# Patient Record
Sex: Female | Born: 1955 | Race: White | Hispanic: No | Marital: Married | Smoking: Former smoker
Health system: Southern US, Community
[De-identification: ages and names within clinical notes are randomized; demographics above are authoritative.]

## PROBLEM LIST (undated history)

## (undated) DIAGNOSIS — K55039 Acute (reversible) ischemia of large intestine, extent unspecified: Principal | ICD-10-CM

## (undated) DIAGNOSIS — Z860100 Personal history of colon polyps, unspecified: Secondary | ICD-10-CM

## (undated) DIAGNOSIS — F419 Anxiety disorder, unspecified: Secondary | ICD-10-CM

## (undated) DIAGNOSIS — F418 Other specified anxiety disorders: Secondary | ICD-10-CM

## (undated) DIAGNOSIS — M797 Fibromyalgia: Secondary | ICD-10-CM

## (undated) DIAGNOSIS — Z973 Presence of spectacles and contact lenses: Secondary | ICD-10-CM

## (undated) DIAGNOSIS — Z5189 Encounter for other specified aftercare: Secondary | ICD-10-CM

## (undated) DIAGNOSIS — S4292XA Fracture of left shoulder girdle, part unspecified, initial encounter for closed fracture: Secondary | ICD-10-CM

## (undated) DIAGNOSIS — R011 Cardiac murmur, unspecified: Secondary | ICD-10-CM

## (undated) DIAGNOSIS — F32A Depression, unspecified: Secondary | ICD-10-CM

## (undated) DIAGNOSIS — G4700 Insomnia, unspecified: Secondary | ICD-10-CM

## (undated) DIAGNOSIS — K5790 Diverticulosis of intestine, part unspecified, without perforation or abscess without bleeding: Secondary | ICD-10-CM

## (undated) HISTORY — DX: Personal history of colonic polyps: Z86.010

## (undated) HISTORY — DX: Anxiety disorder, unspecified: F41.9

## (undated) HISTORY — PX: HYSTEROTOMY: SHX1776

## (undated) HISTORY — PX: CATARACT EXTRACTION: SUR2

## (undated) HISTORY — DX: Insomnia, unspecified: G47.00

## (undated) HISTORY — DX: Diverticulosis of intestine, part unspecified, without perforation or abscess without bleeding: K57.90

## (undated) HISTORY — PX: TONSILLECTOMY: SUR1361

## (undated) HISTORY — PX: ABDOMINAL HYSTERECTOMY: SHX81

## (undated) HISTORY — DX: Cardiac murmur, unspecified: R01.1

## (undated) HISTORY — DX: Fibromyalgia: M79.7

## (undated) HISTORY — DX: Other specified anxiety disorders: F41.8

## (undated) HISTORY — PX: WISDOM TOOTH EXTRACTION: SHX21

## (undated) HISTORY — PX: TONSILECTOMY, ADENOIDECTOMY, BILATERAL MYRINGOTOMY AND TUBES: SHX2538

## (undated) HISTORY — PX: DILATION AND CURETTAGE OF UTERUS: SHX78

## (undated) HISTORY — DX: Encounter for other specified aftercare: Z51.89

## (undated) HISTORY — DX: Acute (reversible) ischemia of large intestine, extent unspecified: K55.039

## (undated) HISTORY — DX: Personal history of colon polyps, unspecified: Z86.0100

---

## 1999-08-19 ENCOUNTER — Ambulatory Visit (HOSPITAL_COMMUNITY): Admission: RE | Admit: 1999-08-19 | Discharge: 1999-08-19 | Payer: Self-pay | Admitting: Gynecology

## 1999-08-19 ENCOUNTER — Encounter (INDEPENDENT_AMBULATORY_CARE_PROVIDER_SITE_OTHER): Payer: Self-pay | Admitting: Specialist

## 1999-10-21 ENCOUNTER — Encounter: Payer: Self-pay | Admitting: Gynecology

## 1999-10-21 ENCOUNTER — Encounter: Admission: RE | Admit: 1999-10-21 | Discharge: 1999-10-21 | Payer: Self-pay | Admitting: Gynecology

## 1999-10-27 ENCOUNTER — Other Ambulatory Visit: Admission: RE | Admit: 1999-10-27 | Discharge: 1999-10-27 | Payer: Self-pay | Admitting: Gynecology

## 2000-10-02 ENCOUNTER — Other Ambulatory Visit: Admission: RE | Admit: 2000-10-02 | Discharge: 2000-10-02 | Payer: Self-pay | Admitting: Gynecology

## 2000-10-29 ENCOUNTER — Encounter: Admission: RE | Admit: 2000-10-29 | Discharge: 2000-10-29 | Payer: Self-pay | Admitting: Gynecology

## 2000-10-29 ENCOUNTER — Encounter: Payer: Self-pay | Admitting: Gynecology

## 2001-07-23 ENCOUNTER — Encounter: Admission: RE | Admit: 2001-07-23 | Discharge: 2001-07-23 | Payer: Self-pay | Admitting: Gynecology

## 2001-07-23 ENCOUNTER — Encounter: Payer: Self-pay | Admitting: Gynecology

## 2001-10-22 ENCOUNTER — Other Ambulatory Visit: Admission: RE | Admit: 2001-10-22 | Discharge: 2001-10-22 | Payer: Self-pay | Admitting: Gynecology

## 2001-11-01 ENCOUNTER — Encounter: Admission: RE | Admit: 2001-11-01 | Discharge: 2001-11-01 | Payer: Self-pay

## 2001-11-01 ENCOUNTER — Encounter: Payer: Self-pay | Admitting: Gynecology

## 2002-10-28 ENCOUNTER — Other Ambulatory Visit: Admission: RE | Admit: 2002-10-28 | Discharge: 2002-10-28 | Payer: Self-pay | Admitting: Gynecology

## 2002-11-17 ENCOUNTER — Encounter: Admission: RE | Admit: 2002-11-17 | Discharge: 2002-11-17 | Payer: Self-pay | Admitting: Gynecology

## 2002-11-17 ENCOUNTER — Encounter: Payer: Self-pay | Admitting: Gynecology

## 2003-11-02 ENCOUNTER — Other Ambulatory Visit: Admission: RE | Admit: 2003-11-02 | Discharge: 2003-11-02 | Payer: Self-pay | Admitting: Gynecology

## 2004-01-04 ENCOUNTER — Encounter: Admission: RE | Admit: 2004-01-04 | Discharge: 2004-01-04 | Payer: Self-pay | Admitting: Gynecology

## 2004-11-07 ENCOUNTER — Other Ambulatory Visit: Admission: RE | Admit: 2004-11-07 | Discharge: 2004-11-07 | Payer: Self-pay | Admitting: Gynecology

## 2004-12-26 ENCOUNTER — Ambulatory Visit: Payer: Self-pay | Admitting: Internal Medicine

## 2005-01-04 ENCOUNTER — Encounter: Admission: RE | Admit: 2005-01-04 | Discharge: 2005-01-04 | Payer: Self-pay | Admitting: Gynecology

## 2005-08-14 HISTORY — PX: TOTAL ABDOMINAL HYSTERECTOMY W/ BILATERAL SALPINGOOPHORECTOMY: SHX83

## 2005-11-27 ENCOUNTER — Other Ambulatory Visit: Admission: RE | Admit: 2005-11-27 | Discharge: 2005-11-27 | Payer: Self-pay | Admitting: Gynecology

## 2006-01-09 ENCOUNTER — Encounter: Admission: RE | Admit: 2006-01-09 | Discharge: 2006-01-09 | Payer: Self-pay | Admitting: Gynecology

## 2006-07-16 ENCOUNTER — Ambulatory Visit (HOSPITAL_COMMUNITY): Admission: RE | Admit: 2006-07-16 | Discharge: 2006-07-17 | Payer: Self-pay | Admitting: Gynecology

## 2006-07-16 ENCOUNTER — Encounter (INDEPENDENT_AMBULATORY_CARE_PROVIDER_SITE_OTHER): Payer: Self-pay | Admitting: *Deleted

## 2006-12-25 ENCOUNTER — Other Ambulatory Visit: Admission: RE | Admit: 2006-12-25 | Discharge: 2006-12-25 | Payer: Self-pay | Admitting: Gynecology

## 2007-01-14 ENCOUNTER — Encounter: Admission: RE | Admit: 2007-01-14 | Discharge: 2007-01-14 | Payer: Self-pay | Admitting: Gynecology

## 2007-09-10 ENCOUNTER — Ambulatory Visit: Payer: Self-pay | Admitting: Internal Medicine

## 2007-09-20 ENCOUNTER — Ambulatory Visit: Payer: Self-pay | Admitting: Internal Medicine

## 2007-09-20 ENCOUNTER — Encounter: Payer: Self-pay | Admitting: Internal Medicine

## 2007-09-20 HISTORY — PX: COLONOSCOPY W/ BIOPSIES: SHX1374

## 2008-01-21 ENCOUNTER — Encounter: Admission: RE | Admit: 2008-01-21 | Discharge: 2008-01-21 | Payer: Self-pay | Admitting: Gynecology

## 2009-01-22 ENCOUNTER — Encounter: Admission: RE | Admit: 2009-01-22 | Discharge: 2009-01-22 | Payer: Self-pay | Admitting: Gynecology

## 2009-02-03 ENCOUNTER — Encounter: Admission: RE | Admit: 2009-02-03 | Discharge: 2009-02-03 | Payer: Self-pay | Admitting: Gynecology

## 2010-02-07 ENCOUNTER — Encounter: Admission: RE | Admit: 2010-02-07 | Discharge: 2010-02-07 | Payer: Self-pay | Admitting: Gynecology

## 2010-09-05 ENCOUNTER — Encounter: Payer: Self-pay | Admitting: Gynecology

## 2010-12-30 NOTE — H&P (Signed)
Rebecca Chapman, Rebecca Chapman              ACCOUNT NO.:  [id number]   MEDICAL RECORD NO.:  [id number]          PATIENT TYPE:  OIB   LOCATION:  1612                         FACILITY:  [facility]   PHYSICIAN:  Gretta Cool, M.D. DATE OF BIRTH:  Apr 28, 1956   DATE OF ADMISSION:  07/16/2006  DATE OF DISCHARGE:                              HISTORY & PHYSICAL   PREOPERATIVE DIAGNOSIS:  Incapacitating pelvic pain and dyspareunia.   POSTOPERATIVE DIAGNOSIS:  Endometriosis and probable adenomyosis with  incapacitating cyclic pain.   PROCEDURE:  Total abdominal hysterectomy and bilateral salpingo-  oophorectomy, lysis of adhesions, and revision of keloid scar from two  previous cesarean sections.   SURGEON:  Gretta Cool, M.D.   ASSISTANT:  Almedia Balls. Randell Patient, M.D.   ANESTHESIA:  General orotracheal.   DESCRIPTION OF PROCEDURE:  Under excellent anesthesia, as above, patient  was prepped and draped in the lithotomy position with Foley catheter  draining her bladder, a Pfannenstiel incision was made by excision of  her previous two keloid cesarean section scars.  The scars were totally  excised.  The incision was then extended through the fascia, the  peritoneum opened, and the abdomen explored.  There were no  abnormalities identified in the upper abdomen.  Examination of the  pelvis revealed rectosigmoid adherent to the left fallopian tube at the  site of endometriosis involving the distal tube and ovary.  The  adhesions were lysed by cautery and sharp dissection.  At this point,  the bowel was packed out of the way and the uterus elevated with Kelly  clamps across the adnexal structures.  A baby Education administrator  was used.  At this point, the round ligaments were transected.  The  anterior leaf of the broad ligament was then opened and pushed off the  lower segment.  The infundibulopelvic vessels were then isolated from  the aorta, clamped, cut, and suture-tied with 0 Vicryl.  A second  suture  was used to doubly ligate the infundibulopelvic on each side.  At this  point, the uterine vessels were clamped, cut, sutured, and tied.  The  cardinal ligaments were then clamped, cut, sutured, and tied with 0  Vicryl.  The uterosacral ligaments were then clamped, cut, sutured, and  tied with 0 Vicryl.  The central portion of the vagina was then  approximated with a running suture of 0 Vicryl from each angle to the  midline.  At this point, a cardinal uterosacral colposuspension was  performed using 2-0 Novofil.  The cardinal uterosacral complex was  affixed to the apex of the vaginal cuff.  At this point, the pelvic  floor is reperitonealized with a running suture of 2-0 Monocryl.  The  sponges and retractors were then removed.  The abdominal peritoneum was  then closed with a running suture of 0 Monocryl from the umbilicus to  the symphysis.  The rectus muscles were plicated in the midline.  Also  with the same 0 Monocryl.  The fascia was then approximated from each  angle to the midline with a running suture of 0 Vicryl.  The  subcutaneous tissues were then approximated with interrupted sutures of  3-0 Vicryl, and the skin was closed with skin staples and intermittent  sutures to prevent inversion of the incision edge because of a torque  from her previous incision and resection of the previous scars.  Patient  returned to the recovery room in excellent condition.           ______________________________  Gretta Cool, M.D.     CWL/MEDQ  D:  07/16/2006  T:  07/16/2006  Job:  [id number]

## 2010-12-30 NOTE — H&P (Signed)
NAMEVELA, RENDER              ACCOUNT NO.:  [id number]   MEDICAL RECORD NO.:  [id number]          PATIENT TYPE:  AMB   LOCATION:  DAY                          FACILITY:  [facility]   PHYSICIAN:  Gretta Cool, M.D. DATE OF BIRTH:  [birth year]   DATE OF ADMISSION:  DATE OF DISCHARGE:                              HISTORY & PHYSICAL   CHIEF COMPLAINT:  Cyclic pelvic pain and dyspareunia.   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  She had cesarean section delivery and  successful pregnancy outcome x2 with cesarean section.  She had multiple  pregnancy loss in the first trimester and small fetuses weighing only 5  pounds 13 ounces.  She has had no sequelae of this until the last few  years when she developed abnormal uterine bleeding.  She had  hysteroscopy and attempted resection of her endometrium.  She had a T-  shaped uterus that made hysteroscopy very difficult and incomplete.  She  has subsequently developed a hematometra drained successfully but she  continues to have elevation of CA125 and evidence of endometriosis with  incapacitating cyclic pelvic pain.  She now has intense dyspareunia as  well.  She is admitted for definitive therapy by abdominal hysterectomy,  possible salpingo-oophorectomy, possible excision of extensive  endometriosis.   PAST MEDICAL HISTORY:  1. Usual childhood disease without sequelae.  2. DES exposure in utero.  3. 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   PREVIOUS SURGERIES:  1. D&C x3.  2. Cesarean section x2.  3. T&A as a child.  4. Wisdom teeth extractions.   ALLERGIES:  SULFA drugs.   PRESENT MEDICATION:  1. Xanax 0.5.  2. Temazepam as needed for anxiety.   FAMILY HISTORY:  Father is living with heart problems.  Mother is  deceased from suicide and multiple sclerosis.  One sister has lupus and  has had breast disease.  One brother in good health.   No other known  familial tendency to disease.   REVIEW OF SYSTEMS:  HEENT:  Denies symptoms.  CARDIORESPIRATORY:  Denies  asthma, cough, bronchitis, shortness of breath.  GI/GU:  Denies  frequency, urgency, dysuria, change in bowel habits, food intolerance.   PHYSICAL EXAMINATION:  A well-developed, well-nourished, thin white  female.  HEENT:  Pupils are equal, round, and reactive to light and  accommodation.  Fundi not examined.  Oropharynx clear.  Neck supple  without masses or thyroid enlargement.  CHEST:  Clear P to A.  HEART:  Regular rhythm without murmur or cardiac enlargement.  BREASTS:  Without mass, tenderness or nipple discharge.  ABDOMEN:  Soft, scaphoid without mass or organomegaly.  C-section scar  x2.  PELVIC:  External genitalia normal female vagina, clean, rugose.  Cervix  is small with changes related to DES exposure in utero.  Uterus is  hypoplastic.  Adnexa clear.  Tender to manipulation.  Rectovaginal exam  confirms.  EXTREMITIES:  Negative.  NEUROLOGIC:  Physiologic.   IMPRESSION:  1. Incapacitating cyclic pelvic pain compatible with adenomyosis  versus endometriosis with elevation of CA125.  2. Severe dyspareunia.  3. History of abnormal uterine bleeding and hysteroscopy.  4. Generalized anxiety disorder.   PLAN:  Total abdominal hysterectomy, possible bilateral salpingo-  oophorectomy and excision of extensive endometriosis.  The patient  understands the options and alternatives.  All risks and benefits, pain  management, recovery.  She is now admitted for definitive therapy.           ______________________________  Gretta Cool, M.D.     CWL/MEDQ  D:  07/15/2006  T:  07/16/2006  Job:  [id number]   cc:   Titus Dubin. Alwyn Ren, MD,FACP,FCCP  [phone] [place]  [place] [address]

## 2011-01-03 ENCOUNTER — Other Ambulatory Visit: Payer: Self-pay | Admitting: Gynecology

## 2011-01-03 DIAGNOSIS — Z1231 Encounter for screening mammogram for malignant neoplasm of breast: Secondary | ICD-10-CM

## 2011-02-09 ENCOUNTER — Other Ambulatory Visit: Payer: Self-pay | Admitting: Gynecology

## 2011-02-09 ENCOUNTER — Ambulatory Visit
Admission: RE | Admit: 2011-02-09 | Discharge: 2011-02-09 | Disposition: A | Discharge status: Home / Self Care | Payer: BC Managed Care – PPO | Source: Ambulatory Visit | Attending: Gynecology | Admitting: Gynecology

## 2011-02-09 DIAGNOSIS — Z1231 Encounter for screening mammogram for malignant neoplasm of breast: Secondary | ICD-10-CM

## 2012-01-02 ENCOUNTER — Other Ambulatory Visit: Payer: Self-pay | Admitting: Gynecology

## 2012-01-02 DIAGNOSIS — Z1231 Encounter for screening mammogram for malignant neoplasm of breast: Secondary | ICD-10-CM

## 2012-02-20 ENCOUNTER — Ambulatory Visit: Payer: BC Managed Care – PPO

## 2012-02-20 ENCOUNTER — Other Ambulatory Visit: Payer: Self-pay | Admitting: Gynecology

## 2012-02-26 ENCOUNTER — Ambulatory Visit
Admission: RE | Admit: 2012-02-26 | Discharge: 2012-02-26 | Disposition: A | Discharge status: Home / Self Care | Payer: BC Managed Care – PPO | Source: Ambulatory Visit | Attending: Gynecology | Admitting: Gynecology

## 2012-02-26 DIAGNOSIS — Z1231 Encounter for screening mammogram for malignant neoplasm of breast: Secondary | ICD-10-CM

## 2012-07-11 ENCOUNTER — Observation Stay (HOSPITAL_COMMUNITY): Payer: BC Managed Care – PPO

## 2012-07-11 ENCOUNTER — Observation Stay (HOSPITAL_COMMUNITY)
Admission: EM | Admit: 2012-07-11 | Discharge: 2012-07-11 | Disposition: A | Discharge status: Home / Self Care | Payer: BC Managed Care – PPO | Attending: Emergency Medicine | Admitting: Emergency Medicine

## 2012-07-11 ENCOUNTER — Encounter (HOSPITAL_COMMUNITY): Payer: Self-pay | Admitting: Emergency Medicine

## 2012-07-11 DIAGNOSIS — K55039 Acute (reversible) ischemia of large intestine, extent unspecified: Secondary | ICD-10-CM

## 2012-07-11 DIAGNOSIS — Z860100 Personal history of colon polyps, unspecified: Secondary | ICD-10-CM | POA: Insufficient documentation

## 2012-07-11 DIAGNOSIS — R110 Nausea: Secondary | ICD-10-CM | POA: Insufficient documentation

## 2012-07-11 DIAGNOSIS — R630 Anorexia: Secondary | ICD-10-CM | POA: Insufficient documentation

## 2012-07-11 DIAGNOSIS — Z90710 Acquired absence of both cervix and uterus: Secondary | ICD-10-CM | POA: Insufficient documentation

## 2012-07-11 DIAGNOSIS — K921 Melena: Secondary | ICD-10-CM | POA: Insufficient documentation

## 2012-07-11 DIAGNOSIS — R1012 Left upper quadrant pain: Principal | ICD-10-CM | POA: Insufficient documentation

## 2012-07-11 DIAGNOSIS — R197 Diarrhea, unspecified: Secondary | ICD-10-CM | POA: Insufficient documentation

## 2012-07-11 DIAGNOSIS — K5900 Constipation, unspecified: Secondary | ICD-10-CM | POA: Insufficient documentation

## 2012-07-11 DIAGNOSIS — R1032 Left lower quadrant pain: Secondary | ICD-10-CM

## 2012-07-11 HISTORY — DX: Acute (reversible) ischemia of large intestine, extent unspecified: K55.039

## 2012-07-11 LAB — CBC
Hemoglobin: 13.8 g/dL (ref 12.0–15.0)
MCH: 31.2 pg (ref 26.0–34.0)
MCHC: 33.7 g/dL (ref 30.0–36.0)
Platelets: 234 10*3/uL (ref 150–400)
RDW: 12.9 % (ref 11.5–15.5)
WBC: 8.8 10*3/uL (ref 4.0–10.5)

## 2012-07-11 LAB — URINALYSIS, ROUTINE W REFLEX MICROSCOPIC
Bilirubin Urine: NEGATIVE
Glucose, UA: NEGATIVE mg/dL
Ketones, ur: NEGATIVE mg/dL
Leukocytes, UA: NEGATIVE
Specific Gravity, Urine: 1.003 — ABNORMAL LOW (ref 1.005–1.030)
Urobilinogen, UA: 0.2 mg/dL (ref 0.0–1.0)

## 2012-07-11 LAB — URINE MICROSCOPIC-ADD ON

## 2012-07-11 LAB — COMPREHENSIVE METABOLIC PANEL
ALT: 12 U/L (ref 0–35)
CO2: 26 mEq/L (ref 19–32)
GFR calc non Af Amer: >90 mL/min (ref >90)
Sodium: 138 mEq/L (ref 135–145)
Total Protein: 7.5 g/dL (ref 6.0–8.3)

## 2012-07-11 LAB — OCCULT BLOOD, POC DEVICE: Fecal Occult Bld: POSITIVE

## 2012-07-11 MED ORDER — SODIUM CHLORIDE 0.9 % IV BOLUS (SEPSIS)
1000.0000 mL | Freq: Once | INTRAVENOUS | Status: AC
  Administered 2012-07-11: 1000 mL via INTRAVENOUS

## 2012-07-11 MED ORDER — HYDROCODONE-ACETAMINOPHEN 5-325 MG PO TABS: 1.0000 | ORAL_TABLET | Freq: Four times a day (QID) | ORAL | Status: DC | PRN

## 2012-07-11 MED ORDER — IOHEXOL 300 MG/ML  SOLN
100.0000 mL | Freq: Once | INTRAMUSCULAR | Status: AC | PRN
  Administered 2012-07-11: 100 mL via INTRAVENOUS

## 2012-07-11 MED ORDER — CIPROFLOXACIN HCL 500 MG PO TABS: 500.0000 mg | ORAL_TABLET | Freq: Two times a day (BID) | ORAL | Status: DC

## 2012-07-11 MED ORDER — CIPROFLOXACIN HCL 500 MG PO TABS
500.0000 mg | ORAL_TABLET | Freq: Once | ORAL | Status: AC
  Administered 2012-07-11: 500 mg via ORAL
  Filled 2012-07-11: qty 1

## 2012-07-11 MED ORDER — METRONIDAZOLE 500 MG PO TABS
500.0000 mg | ORAL_TABLET | Freq: Once | ORAL | Status: AC
  Administered 2012-07-11: 500 mg via ORAL
  Filled 2012-07-11: qty 1

## 2012-07-11 MED ORDER — METRONIDAZOLE 500 MG PO TABS: 500.0000 mg | ORAL_TABLET | Freq: Two times a day (BID) | ORAL | Status: DC

## 2012-07-11 MED ORDER — FAMOTIDINE IN NACL 20-0.9 MG/50ML-% IV SOLN
20.0000 mg | Freq: Once | INTRAVENOUS | Status: AC
  Administered 2012-07-11: 20 mg via INTRAVENOUS
  Filled 2012-07-11: qty 50

## 2012-07-11 NOTE — ED Notes (Signed)
PT HAS ARRIVED IN CDU FROM CT 

## 2012-07-11 NOTE — ED Provider Notes (Signed)
55 yo sent to CDU pending CT results for r/o diverticulitis based on presentation of hematochezia & LLQ pain. BP 146/74  Pulse 70  Temp 97.6 F (36.4 C) (Oral)  Resp 20  SpO2 100% Imaging as below discussed w attending Dr. Oletta Lamas who feels pt can be dc w cipro, flagyl, pain medication and GI follow up. LAbs reviewed & hgb nl. STRICT return precautions discussed. Pt agreeable with plan. First dose of abx given in CDU.   CT ABDOMEN AND PELVIS WITH CONTRAST Technique: Multidetector CT imaging of the abdomen and pelvis was performed following the standard protocol during bolus administration of intravenous contrast. Contrast: OMNIPAQUE IOHEXOL 300 MG/ML SOLN Comparison: None. Findings: Lung bases are clear. Liver, gallbladder, adrenal glands, kidneys, spleen, and pancreas are normal. No intra- abdominal free air, lymphadenopathy, or free fluid. There is short segmental splenic flexure transverse/descending colonic wall thickening and minimal surrounding stranding. No surrounding rim enhancing fluid collection is identified to suggest abscess formation. Celiac axis, superior mesenteric artery, and inferior mesenteric artery are patent. No other area of bowel wall thickening is identified. No bowel dilatation. The appendix is not visualized but there is no secondary evidence for acute appendicitis. Cecum is low lying within the pelvis. Uterus and ovaries not visualized but no adnexal mass. No acute osseous finding. Lumbar spine degenerative change is noted particularly at L5 S1. IMPRESSION: Short segmental splenic flexure transverse/descending colonic wall thickening with minimal surrounding stranding but no evidence for abscess formation or perforation. Differential considerations include focal colitis, ischemia and given the watershed distribution, inflammatory bowel disease, or other enteritis.     Jaci Carrel, [place] 07/11/12 1630

## 2012-07-11 NOTE — ED Provider Notes (Signed)
Medical screening examination/treatment/procedure(s) were performed by non-physician practitioner and as supervising physician I was immediately available for consultation/collaboration.   Gavin Pound. Junie Engram, MD 07/11/12 1606

## 2012-07-11 NOTE — ED Notes (Signed)
PT HAS GONE TO CT. WILL COME TO CDU AFTER STUDY

## 2012-07-11 NOTE — ED Notes (Signed)
Pt sts diarrhea yesterday with BRB this am and LLQ pain

## 2012-07-11 NOTE — ED Provider Notes (Signed)
History     CSN: [id number]  Arrival date & time 07/11/12  1222   First MD Initiated Contact with Patient 07/11/12 1230      Chief Complaint  Patient presents with  . Rectal Bleeding    (Consider location/radiation/quality/duration/timing/severity/associated sxs/prior treatment) Patient is a 56 y.o. female presenting with hematochezia. The history is provided by the patient and the spouse. No language interpreter was used.  Rectal Bleeding  The current episode started today. The onset was gradual. The problem occurs occasionally. The pain is mild. The stool is described as liquid and bloody. Associated symptoms include abdominal pain and diarrhea. Pertinent negatives include no fever, no nausea, no rectal pain, no vomiting, no hematuria, no vaginal bleeding, no vaginal discharge, no headaches, no coughing and no difficulty breathing. She has been behaving normally. She has been eating less than usual. Urine output has been normal. Her past medical history does not include recent antibiotic use or a recent illness. There were no sick contacts.   57 year old female coming in with rectal bleeding since 2 AM this morning. States that she has been passing clots in her stool with positive diarrhea. States that the clots are bright red and showed me a picture of the same.   States she's also had some nausea. A week ago patient was constipated.   Past medical history of abdominal hysterectomy. States that 5 years ago she had a colonoscopy with the [facility] route that showed polyps.  Patient denies dizziness or weakness. Her PCP is Dr. Timothy Lasso. She has had decreased appetite and is able to take by mouth fluids. Takes 81 mg asa / day.     History reviewed. No pertinent past medical history.  History reviewed. No pertinent past surgical history.  History reviewed. No pertinent family history.  History  Substance Use Topics  . Smoking status: Never Smoker   . Smokeless tobacco: Not on file  .  Alcohol Use: Yes    OB History    Grav Para Term Preterm Abortions TAB SAB Ect Mult Living                  Review of Systems  Constitutional: Negative.  Negative for fever.  HENT: Negative.   Eyes: Negative.   Respiratory: Negative.  Negative for cough and shortness of breath.   Cardiovascular: Negative.   Gastrointestinal: Positive for abdominal pain, diarrhea, constipation, blood in stool and hematochezia. Negative for nausea, vomiting, abdominal distention and rectal pain.  Genitourinary: Negative for dysuria, urgency, frequency, hematuria, vaginal bleeding, vaginal discharge and difficulty urinating.  Musculoskeletal: Negative for back pain.  Skin: Negative.   Neurological: Negative.  Negative for headaches.  Psychiatric/Behavioral: Negative.   All other systems reviewed and are negative.    Allergies  Other and Sulfa antibiotics  Home Medications   Current Outpatient Rx  Name  Route  Sig  Dispense  Refill  . ALPRAZOLAM 0.5 MG PO TABS   Oral   Take 0.25 mg by mouth at bedtime as needed. For sleep         . ASPIRIN EC 81 MG PO TBEC   Oral   Take 81 mg by mouth daily.         Marland Kitchen VITAMIN D 1000 UNITS PO TABS   Oral   Take 2,000 Units by mouth daily. Gummies         . ESTRADIOL 0.0375 MG/24HR TD PTTW   Transdermal   Place 0.5 patches onto the skin 2 (two) times  a week.         . NORTRIPTYLINE HCL 10 MG PO CAPS   Oral   Take 10 mg by mouth at bedtime.           BP 145/84  Pulse 70  Temp 97.6 F (36.4 C) (Oral)  Resp 20  SpO2 100%  Physical Exam  Nursing note and vitals reviewed. Constitutional: She is oriented to person, place, and time. She appears well-developed and well-nourished.  HENT:  Head: Normocephalic and atraumatic.  Eyes: Conjunctivae normal and EOM are normal. Pupils are equal, round, and reactive to light.  Neck: Normal range of motion. Neck supple.  Cardiovascular: Normal rate.   Pulmonary/Chest: Effort normal and breath  sounds normal. No respiratory distress. She has no wheezes.  Abdominal: Soft. She exhibits no distension and no mass. There is tenderness. There is no rebound and no guarding.       Left lower quadrant tenderness  Musculoskeletal: Normal range of motion. She exhibits no edema and no tenderness.  Neurological: She is alert and oriented to person, place, and time. She has normal reflexes.  Skin: Skin is warm and dry.  Psychiatric: She has a normal mood and affect.    ED Course  Procedures (including critical care time)    Hemacult +    Report given to [facility] on CDU unit where patient will go awaiting CT of the abdomen to r/o diverticulitis.  If positive for diverticulitis patient will go home on antibiotics with a GI referral.  If no diverticulitis GI follow up only.  Patient and spouse understand the plan and agree.   Labs Reviewed  COMPREHENSIVE METABOLIC PANEL - Abnormal; Notable for the following:    Glucose, Bld 116 (*)     All other components within normal limits  URINALYSIS, ROUTINE W REFLEX MICROSCOPIC - Abnormal; Notable for the following:    Color, Urine COLORLESS (*)     Specific Gravity, Urine 1.003 (*)     Hgb urine dipstick SMALL (*)     All other components within normal limits  CBC  URINE MICROSCOPIC-ADD ON  OCCULT BLOOD X 1 CARD TO LAB, STOOL  OCCULT BLOOD, POC DEVICE   No results found.   No diagnosis found.    MDM          Remi Haggard, NP 07/11/12 1537

## 2012-07-11 NOTE — ED Provider Notes (Signed)
Medical screening examination/treatment/procedure(s) were performed by non-physician practitioner and as supervising physician I was immediately available for consultation/collaboration.  CT suggestive of colitis, not overt diverticulitis, however clinical history is suggestive of diverticulitis, will treat with oral abx and pt can be referred to PCP and/or GI follow up.    Rebecca Chapman. Oletta Lamas, MD 07/11/12 [id number]

## 2012-07-15 ENCOUNTER — Encounter: Payer: Self-pay | Admitting: Internal Medicine

## 2012-07-29 ENCOUNTER — Encounter: Payer: Self-pay | Admitting: Internal Medicine

## 2012-07-29 ENCOUNTER — Other Ambulatory Visit (INDEPENDENT_AMBULATORY_CARE_PROVIDER_SITE_OTHER): Payer: BC Managed Care – PPO

## 2012-07-29 ENCOUNTER — Ambulatory Visit (INDEPENDENT_AMBULATORY_CARE_PROVIDER_SITE_OTHER): Payer: BC Managed Care – PPO | Admitting: Internal Medicine

## 2012-07-29 VITALS — BP 108/72 | HR 81 | Ht 66.0 in | Wt 120.4 lb

## 2012-07-29 DIAGNOSIS — K55039 Acute (reversible) ischemia of large intestine, extent unspecified: Secondary | ICD-10-CM

## 2012-07-29 DIAGNOSIS — K55059 Acute (reversible) ischemia of intestine, part and extent unspecified: Secondary | ICD-10-CM

## 2012-07-29 DIAGNOSIS — K589 Irritable bowel syndrome without diarrhea: Secondary | ICD-10-CM | POA: Insufficient documentation

## 2012-07-29 LAB — CBC WITH DIFFERENTIAL/PLATELET
Basophils Relative: 0.3 % (ref 0.0–3.0)
Eosinophils Relative: 1.5 % (ref 0.0–5.0)
HCT: 35.9 % — ABNORMAL LOW (ref 36.0–46.0)
Lymphs Abs: 1.4 10*3/uL (ref 0.7–4.0)
MCV: 91.4 fl (ref 78.0–100.0)
Monocytes Relative: 11.4 % (ref 3.0–12.0)
Platelets: 219 10*3/uL (ref 150.0–400.0)
RBC: 3.93 Mil/uL (ref 3.87–5.11)
WBC: 4.4 10*3/uL — ABNORMAL LOW (ref 4.5–10.5)

## 2012-07-29 MED ORDER — DICYCLOMINE HCL 20 MG PO TABS
20.0000 mg | ORAL_TABLET | Freq: Four times a day (QID) | ORAL | Status: DC | PRN
Start: 2012-07-29 — End: 2013-06-02

## 2012-07-29 MED ORDER — ALIGN PO CAPS: 1.0000 | ORAL_CAPSULE | Freq: Every day | ORAL | Status: DC

## 2012-07-29 NOTE — Patient Instructions (Addendum)
My diagnosis is acute ischemic colitis and Irritable Bowel Syndrome. I expect a full recovery from the colitis but may take some time to settle down after the antibiotics and healing.  Take Align each day and use dicyclomine as needed for abdominal pain. You may take it before meals to prevent problems.  I will see you in one month but contact us sooner as needed.  I will let you know the CBC results by My Chart or phone.  Thank you for choosing me and [facility].  Iva Boop, MD, Clementeen Graham

## 2012-07-29 NOTE — Progress Notes (Signed)
Subjective:    Patient ID: Rebecca Chapman, female    DOB: 1955/11/02, 56 y.o.   MRN: [id number] Referred by: Gwen Pounds, MD  HPI Is a very nice lady who was in her usual state of health, although she was constipated for about 2 weeks. Then around November 28 she had acute left lower quadrant pain and hematochezia. She went to the emergency department where there was a limited area of colitis seen on CT scan with stranding and thickening of the wall around the splenic flexure and distal transverse colon. She stopped bleeding within a day or so but she had ongoing abdominal pain which has persisted though is much better. She was given a course of Cipro and metronidazole as empiric therapy. Stools are somewhat loose at this time. She is feeling better but is concerned about what this might have been. She took an omeprazole he other day with some relief of left-sided abdominal pain. She is still feeling bloated and gassy and having borborygmi.  Colonoscopy about 5-6 years ago was reviewed and is negative, she had a hyperplastic polyp in the cecum.  She has recently been evaluated for some headaches her head pain, there is some vascular changes on an MRI and white spots in the gray matter it sounds like and she is due for a second opinion at [facility] tomorrow. She is wondering if there is any leak.  Allergies  Allergen Reactions  . Other     Crab causes nausea  . Sulfa Antibiotics Rash   Outpatient Prescriptions Prior to Visit  Medication Sig Dispense Refill  . ALPRAZolam (XANAX) 0.5 MG tablet Take 0.25 mg by mouth at bedtime as needed. For sleep      . aspirin EC 81 MG tablet Take 81 mg by mouth daily.      . cholecalciferol (VITAMIN D) 1000 UNITS tablet Take 2,000 Units by mouth daily. Gummies      . estradiol (VIVELLE-DOT) 0.0375 MG/24HR Place 0.5 patches onto the skin 2 (two) times a week.      . nortriptyline (PAMELOR) 10 MG capsule Take 10 mg by mouth at bedtime.      . [DISCONTINUED]  ciprofloxacin (CIPRO) 500 MG tablet Take 1 tablet (500 mg total) by mouth 2 (two) times daily.  20 tablet  0  . [DISCONTINUED] metroNIDAZOLE (FLAGYL) 500 MG tablet Take 1 tablet (500 mg total) by mouth 2 (two) times daily.  14 tablet  0  . [DISCONTINUED] HYDROcodone-acetaminophen (NORCO/VICODIN) 5-325 MG per tablet Take 1 tablet by mouth every 6 (six) hours as needed for pain.  15 tablet  0   Last reviewed on 07/29/2012  2:48 PM by Iva Boop, MD Past Medical History  Diagnosis Date  . Diverticulosis   . History of colon polyps     Hyperplastic   . Anxiety    Past Surgical History  Procedure Date  . Colonoscopy w/ biopsies   . Hysterotomy   . Cesarean section     x 2  . Tonsilectomy, adenoidectomy, bilateral myringotomy and tubes    History   Social History  . Marital Status: Married                 Social History Main Topics  . Smoking status: Never Smoker   . Smokeless tobacco: Never Used  . Alcohol Use: Yes     Comment: a glass of wine with dinner  . Drug Use: No   School Agricultural consultant 2 sons  Family  History  Problem Relation Age of Onset  . Heart disease Father   . Multiple sclerosis Mother       Review of Systems As per history of present illness, all other review of systems are negative at this time.    Objective:   Physical Exam General:  Well-developed, well-nourished and in no acute distress Eyes:  anicteric. ENT:   Mouth and posterior pharynx free of lesions.  Neck:   supple w/o thyromegaly or mass.  Lungs: Clear to auscultation bilaterally. Heart:  S1S2, no rubs, murmurs, gallops. Abdomen:  soft, non-tender, no hepatosplenomegaly, hernia, or mass and BS+.  Lymph:  no cervical or supraclavicular adenopathy. Extremities:   no edema Skin   no rash. Neuro:  A&O x 3.  Psych:  appropriate mood and  Affect.   Data Reviewed: Lab Results  Component Value Date   WBC 8.8 07/11/2012   HGB 13.8 07/11/2012   HCT 41.0 07/11/2012   MCV 92.6  07/11/2012   PLT 234 07/11/2012    CT scan 07/11/2012 with findings as outlined in the history of present illness images are reviewed with the patient as well  IMPRESSION:  Short segmental splenic flexure transverse/descending colonic wall  thickening with minimal surrounding stranding but no evidence for  abscess formation or perforation. Differential considerations  include focal colitis, ischemia and given the watershed  distribution, inflammatory bowel disease, or other enteritis.     Assessment & Plan:   1. Acute ischemic colitis   2. IBS (irritable bowel syndrome)    1. She is improving overall I think she has some post colitis IBS and probably has IBS at baseline which predisposes people to acute ischemic colitis from spasm. Note that celiac, SMA and IMA trunks were all open on her CT scan. 2. I have recommended an prescribed align probiotic once a day for a month 3. Dicyclomine 20 mg every 6 hours as needed for cramps is prescribed 4. She will return in a month, we'll also check a hemoglobin today with a CBC. 5. She is reassured at this point, I think this is a self-limited problem. I don't think there is any relationship to whatever is going on in her brain, I don't think she has a vasculitis that's always possible seems unlikely. 6. Given that she had a colonoscopy in last 5-6 years and these are classic findings and symptoms of ischemic colitis I don't feel compelled to pursue a repeat colonoscopy at this time, we will decide further upon followup.  CC: Gwen Pounds, MD

## 2012-09-10 ENCOUNTER — Ambulatory Visit: Payer: BC Managed Care – PPO | Admitting: Internal Medicine

## 2012-09-28 ENCOUNTER — Other Ambulatory Visit: Payer: Self-pay

## 2013-01-29 ENCOUNTER — Other Ambulatory Visit: Payer: Self-pay

## 2013-01-29 DIAGNOSIS — Z1231 Encounter for screening mammogram for malignant neoplasm of breast: Secondary | ICD-10-CM

## 2013-03-05 ENCOUNTER — Ambulatory Visit
Admission: RE | Admit: 2013-03-05 | Discharge: 2013-03-05 | Disposition: A | Discharge status: Home / Self Care | Payer: BC Managed Care – PPO | Source: Ambulatory Visit

## 2013-03-05 DIAGNOSIS — Z1231 Encounter for screening mammogram for malignant neoplasm of breast: Secondary | ICD-10-CM

## 2013-06-02 ENCOUNTER — Ambulatory Visit (INDEPENDENT_AMBULATORY_CARE_PROVIDER_SITE_OTHER): Payer: BC Managed Care – PPO | Admitting: Sports Medicine

## 2013-06-02 ENCOUNTER — Ambulatory Visit
Admission: RE | Admit: 2013-06-02 | Discharge: 2013-06-02 | Disposition: A | Discharge status: Home / Self Care | Payer: BC Managed Care – PPO | Source: Ambulatory Visit | Attending: Sports Medicine | Admitting: Sports Medicine

## 2013-06-02 ENCOUNTER — Encounter: Payer: Self-pay | Admitting: Sports Medicine

## 2013-06-02 VITALS — BP 133/79 | HR 60 | Ht 66.0 in | Wt 118.0 lb

## 2013-06-02 DIAGNOSIS — M545 Low back pain, unspecified: Secondary | ICD-10-CM

## 2013-06-03 ENCOUNTER — Telehealth: Payer: Self-pay | Admitting: Sports Medicine

## 2013-06-03 NOTE — Telephone Encounter (Signed)
I spoke with Rebecca Chapman on the phone today regarding x-rays of her lumbar spine. She has some degenerative disc disease at L5-S1. She will go ahead and start physical therapy for core strengthening. Followup with me in 4 weeks.

## 2013-06-03 NOTE — Progress Notes (Signed)
  Subjective:    Patient ID: Rebecca Chapman, female    DOB: [birth year], 57 y.o.   MRN: [id number]  HPI chief complaint: Low back pain  Very pleasant 57 year old female comes in today complaining of 1 year of intermittent low back pain. She describes a stiffness in her low back which is present first thing in the morning and tends to improve as the day goes on. We'll also get occasional sharp pain in her back which will cause it to "lock up". This will happen only on occasion but she feels like it is happening more frequently. Her first episode of back pain occurred shortly after having a C-section several years ago. No previous low back surgeries. No groin pain. No radiating pain into her legs. No associated numbness or tingling. No change in bowel or bladder habits. She takes Advil as needed and does find it to be helpful. No history of cancer. No history of osteoporosis. Her medications are reviewed Allergies are reviewed He does not smoke, drinks alcohol on occasional basis, and works for the [employer] school system    Review of Systems     Objective:   Physical Exam Thin, no acute distress. Awake alert and oriented x3.  Good lumbar mobility. She has pain both with forward flexion and extension. There is no tenderness to palpation along the lumbar midline. No tenderness to palpation over the SI joints bilaterally. She has a negative log roll bilaterally. Negative straight leg raise bilaterally. Her strength is 5/5 both lower extremities with reflexes equal at the Achilles and patellar tendons bilaterally. No atrophy. Sensation is intact to light touch grossly. Negative FABERS. Walking without a limp.       Assessment & Plan:  Low back pain likely secondary to lumbar degenerative disc disease versus possible facet arthropathy  X-rays to include AP and lateral lumbar spine films. I think the patient would benefit greatly from physical therapy. She can continue with when necessary  Advil as well. Phone followup after I reviewed her x-rays. Followup in the office in 4 weeks.

## 2013-06-19 ENCOUNTER — Other Ambulatory Visit: Payer: Self-pay

## 2014-01-27 ENCOUNTER — Other Ambulatory Visit: Payer: Self-pay

## 2014-01-27 DIAGNOSIS — Z1231 Encounter for screening mammogram for malignant neoplasm of breast: Secondary | ICD-10-CM

## 2014-03-06 ENCOUNTER — Inpatient Hospital Stay: Admission: RE | Admit: 2014-03-06 | Payer: BC Managed Care – PPO | Source: Ambulatory Visit

## 2014-03-09 ENCOUNTER — Ambulatory Visit
Admission: RE | Admit: 2014-03-09 | Discharge: 2014-03-09 | Disposition: A | Discharge status: Home / Self Care | Payer: BC Managed Care – PPO | Source: Ambulatory Visit

## 2014-03-09 DIAGNOSIS — Z1231 Encounter for screening mammogram for malignant neoplasm of breast: Secondary | ICD-10-CM

## 2014-08-19 ENCOUNTER — Other Ambulatory Visit: Payer: Self-pay | Admitting: Internal Medicine

## 2014-08-19 DIAGNOSIS — R519 Headache, unspecified: Secondary | ICD-10-CM

## 2014-08-19 DIAGNOSIS — R51 Headache: Principal | ICD-10-CM

## 2014-08-26 ENCOUNTER — Ambulatory Visit
Admission: RE | Admit: 2014-08-26 | Discharge: 2014-08-26 | Disposition: A | Discharge status: Home / Self Care | Payer: BC Managed Care – PPO | Source: Ambulatory Visit | Attending: Internal Medicine | Admitting: Internal Medicine

## 2014-08-26 DIAGNOSIS — R51 Headache: Principal | ICD-10-CM

## 2014-08-26 DIAGNOSIS — R519 Headache, unspecified: Secondary | ICD-10-CM

## 2014-09-15 ENCOUNTER — Encounter (HOSPITAL_BASED_OUTPATIENT_CLINIC_OR_DEPARTMENT_OTHER): Payer: Self-pay | Admitting: *Deleted

## 2014-09-15 NOTE — Progress Notes (Signed)
No labs needed

## 2014-09-17 ENCOUNTER — Encounter (HOSPITAL_BASED_OUTPATIENT_CLINIC_OR_DEPARTMENT_OTHER): Payer: Self-pay | Admitting: Anesthesiology

## 2014-09-17 ENCOUNTER — Ambulatory Visit (HOSPITAL_BASED_OUTPATIENT_CLINIC_OR_DEPARTMENT_OTHER): Payer: BC Managed Care – PPO | Admitting: Anesthesiology

## 2014-09-17 ENCOUNTER — Encounter (HOSPITAL_BASED_OUTPATIENT_CLINIC_OR_DEPARTMENT_OTHER)
Admission: RE | Disposition: A | Discharge status: Home / Self Care | Payer: Self-pay | Source: Ambulatory Visit | Attending: Plastic Surgery

## 2014-09-17 ENCOUNTER — Ambulatory Visit (HOSPITAL_BASED_OUTPATIENT_CLINIC_OR_DEPARTMENT_OTHER)
Admission: RE | Admit: 2014-09-17 | Discharge: 2014-09-17 | Disposition: A | Discharge status: Home / Self Care | Payer: BC Managed Care – PPO | Source: Ambulatory Visit | Attending: Plastic Surgery | Admitting: Plastic Surgery

## 2014-09-17 DIAGNOSIS — H02839 Dermatochalasis of unspecified eye, unspecified eyelid: Secondary | ICD-10-CM | POA: Diagnosis not present

## 2014-09-17 HISTORY — PX: BROW LIFT: SHX178

## 2014-09-17 HISTORY — DX: Presence of spectacles and contact lenses: Z97.3

## 2014-09-17 LAB — POCT HEMOGLOBIN-HEMACUE: Hemoglobin: 14 g/dL (ref 12.0–15.0)

## 2014-09-17 SURGERY — BLEPHAROPLASTY
Anesthesia: General | Site: Eye | Laterality: Bilateral

## 2014-09-17 MED ORDER — LIDOCAINE-EPINEPHRINE (PF) 1 %-1:200000 IJ SOLN
INTRAMUSCULAR | Status: AC
  Filled 2014-09-17: qty 20

## 2014-09-17 MED ORDER — DEXAMETHASONE SODIUM PHOSPHATE 4 MG/ML IJ SOLN
INTRAMUSCULAR | Status: DC | PRN
  Administered 2014-09-17: 10 mg via INTRAVENOUS

## 2014-09-17 MED ORDER — PROPOFOL 10 MG/ML IV BOLUS
INTRAVENOUS | Status: DC | PRN
  Administered 2014-09-17: 200 mg via INTRAVENOUS

## 2014-09-17 MED ORDER — FENTANYL CITRATE 0.05 MG/ML IJ SOLN
INTRAMUSCULAR | Status: DC | PRN
  Administered 2014-09-17: 25 ug via INTRAVENOUS
  Administered 2014-09-17: 100 ug via INTRAVENOUS

## 2014-09-17 MED ORDER — PROPOFOL 10 MG/ML IV BOLUS
INTRAVENOUS | Status: AC
  Filled 2014-09-17: qty 20

## 2014-09-17 MED ORDER — OXYCODONE HCL 5 MG PO TABS: 5.0000 mg | ORAL_TABLET | Freq: Once | ORAL | Status: DC | PRN

## 2014-09-17 MED ORDER — LIDOCAINE-EPINEPHRINE (PF) 2 %-1:200000 IJ SOLN
INTRAMUSCULAR | Status: DC | PRN
  Administered 2014-09-17: 4 mL via INTRADERMAL

## 2014-09-17 MED ORDER — LACTATED RINGERS IV SOLN
INTRAVENOUS | Status: DC
  Administered 2014-09-17 (×2): via INTRAVENOUS

## 2014-09-17 MED ORDER — HYDROMORPHONE HCL 1 MG/ML IJ SOLN: 0.2500 mg | INTRAMUSCULAR | Status: DC | PRN

## 2014-09-17 MED ORDER — GLYCOPYRROLATE 0.2 MG/ML IJ SOLN
INTRAMUSCULAR | Status: DC | PRN
  Administered 2014-09-17: 0.2 mg via INTRAVENOUS

## 2014-09-17 MED ORDER — OXYCODONE HCL 5 MG/5ML PO SOLN: 5.0000 mg | Freq: Once | ORAL | Status: DC | PRN

## 2014-09-17 MED ORDER — CEFAZOLIN SODIUM 1-5 GM-% IV SOLN
1.0000 g | Freq: Once | INTRAVENOUS | Status: AC
  Administered 2014-09-17: 2 g via INTRAVENOUS

## 2014-09-17 MED ORDER — FENTANYL CITRATE 0.05 MG/ML IJ SOLN: 50.0000 ug | INTRAMUSCULAR | Status: DC | PRN

## 2014-09-17 MED ORDER — MIDAZOLAM HCL 5 MG/5ML IJ SOLN
INTRAMUSCULAR | Status: DC | PRN
  Administered 2014-09-17: 2 mg via INTRAVENOUS

## 2014-09-17 MED ORDER — ONDANSETRON HCL 4 MG/2ML IJ SOLN
4.0000 mg | Freq: Once | INTRAMUSCULAR | Status: DC | PRN
Start: 2014-09-17 — End: 2014-09-17

## 2014-09-17 MED ORDER — ONDANSETRON HCL 4 MG/2ML IJ SOLN
INTRAMUSCULAR | Status: DC | PRN
  Administered 2014-09-17: 4 mg via INTRAVENOUS

## 2014-09-17 MED ORDER — BSS IO SOLN
INTRAOCULAR | Status: AC
  Filled 2014-09-17: qty 15

## 2014-09-17 MED ORDER — FENTANYL CITRATE 0.05 MG/ML IJ SOLN
INTRAMUSCULAR | Status: AC
  Filled 2014-09-17: qty 4

## 2014-09-17 MED ORDER — MIDAZOLAM HCL 2 MG/2ML IJ SOLN
INTRAMUSCULAR | Status: AC
  Filled 2014-09-17: qty 2

## 2014-09-17 MED ORDER — HYALURONIDASE HUMAN 150 UNIT/ML IJ SOLN
150.0000 [IU] | Freq: Once | INTRAMUSCULAR | Status: DC
  Filled 2014-09-17: qty 1

## 2014-09-17 MED ORDER — LIDOCAINE 5 % EX OINT
TOPICAL_OINTMENT | CUTANEOUS | Status: AC
  Filled 2014-09-17: qty 35.44

## 2014-09-17 MED ORDER — LIDOCAINE HCL (CARDIAC) 20 MG/ML IV SOLN
INTRAVENOUS | Status: DC | PRN
  Administered 2014-09-17: 80 mg via INTRAVENOUS

## 2014-09-17 MED ORDER — HYALURONIDASE OVINE 200 UNIT/ML IJ SOLN
INTRAMUSCULAR | Status: DC | PRN
  Administered 2014-09-17: 150 [IU] via SUBCUTANEOUS

## 2014-09-17 MED ORDER — MIDAZOLAM HCL 2 MG/2ML IJ SOLN: 1.0000 mg | INTRAMUSCULAR | Status: DC | PRN

## 2014-09-17 SURGICAL SUPPLY — 36 items
APPLICATOR COTTON TIP 6IN STRL (MISCELLANEOUS) ×3 IMPLANT
APPLICATOR DR MATTHEWS STRL (MISCELLANEOUS) IMPLANT
BENZOIN TINCTURE PRP APPL 2/3 (GAUZE/BANDAGES/DRESSINGS) ×3 IMPLANT
BLADE SURG 15 STRL LF DISP TIS (BLADE) ×1 IMPLANT
BLADE SURG 15 STRL SS (BLADE) ×2
CLEANER CAUTERY TIP 5X5 PAD (MISCELLANEOUS) IMPLANT
COVER BACK TABLE 60X90IN (DRAPES) ×3 IMPLANT
COVER MAYO STAND STRL (DRAPES) ×3 IMPLANT
DRAPE U-SHAPE 76X120 STRL (DRAPES) ×3 IMPLANT
ELECT NEEDLE BLADE 2-5/6 (NEEDLE) ×3 IMPLANT
ELECT NEEDLE TIP 2.8 STRL (NEEDLE) IMPLANT
ELECT REM PT RETURN 9FT ADLT (ELECTROSURGICAL) ×3
ELECTRODE REM PT RTRN 9FT ADLT (ELECTROSURGICAL) ×1 IMPLANT
GAUZE SPONGE 4X4 12PLY STRL (GAUZE/BANDAGES/DRESSINGS) IMPLANT
GLOVE BIO SURGEON STRL SZ 6.5 (GLOVE) ×2 IMPLANT
GLOVE BIO SURGEONS STRL SZ 6.5 (GLOVE) ×1
GLOVE BIOGEL PI IND STRL 6.5 (GLOVE) ×1 IMPLANT
GLOVE BIOGEL PI INDICATOR 6.5 (GLOVE) ×2
GOWN STRL REUS W/ TWL LRG LVL3 (GOWN DISPOSABLE) ×1 IMPLANT
GOWN STRL REUS W/TWL LRG LVL3 (GOWN DISPOSABLE) ×2
NEEDLE PRECISIONGLIDE 27X1.5 (NEEDLE) ×3 IMPLANT
NEEDLE SPNL 18GX3.5 QUINCKE PK (NEEDLE) IMPLANT
NS IRRIG 1000ML POUR BTL (IV SOLUTION) ×3 IMPLANT
PACK BASIN DAY SURGERY FS (CUSTOM PROCEDURE TRAY) ×3 IMPLANT
PAD CLEANER CAUTERY TIP 5X5 (MISCELLANEOUS)
PENCIL BUTTON HOLSTER BLD 10FT (ELECTRODE) ×3 IMPLANT
SHEET MEDIUM DRAPE 40X70 STRL (DRAPES) ×3 IMPLANT
SHEILD EYE MED CORNL SHD 22X21 (OPHTHALMIC RELATED)
SHIELD EYE MED CORNL SHD 22X21 (OPHTHALMIC RELATED) IMPLANT
SPONGE GAUZE 4X4 12PLY STER LF (GAUZE/BANDAGES/DRESSINGS) IMPLANT
SUT PLAIN GUT (SUTURE) IMPLANT
SUT PROLENE 4 0 PS 2 18 (SUTURE) IMPLANT
SUT SILK 6 0 P 1 (SUTURE) IMPLANT
SYR CONTROL 10ML LL (SYRINGE) ×3 IMPLANT
SYR TB 1ML LL NO SAFETY (SYRINGE) IMPLANT
TOWEL OR 17X24 6PK STRL BLUE (TOWEL DISPOSABLE) ×3 IMPLANT

## 2014-09-17 NOTE — Transfer of Care (Signed)
Immediate Anesthesia Transfer of Care Note  Patient: Rebecca Chapman  Procedure(s) Performed: Procedure(s): BILATERAL UPPER BLEPHAROPLASTY (Bilateral)  Patient Location: PACU  Anesthesia Type:General  Level of Consciousness: sedated  Airway & Oxygen Therapy: Patient Spontanous Breathing and Patient connected to face mask oxygen  Post-op Assessment: Report given to RN and Post -op Vital signs reviewed and stable  Post vital signs: Reviewed and stable  Last Vitals:  Filed Vitals:   09/17/14 1247  BP: 125/72  Pulse: 60  Temp: 36.8 C  Resp: 16    Complications: No apparent anesthesia complications

## 2014-09-17 NOTE — Brief Op Note (Signed)
09/17/2014  4:24 PM  PATIENT:  Rebecca Chapman  59 y.o. 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   Delay start of Pharmacological VTE agent (>24hrs) due to surgical blood loss or risk of bleeding: not applicable

## 2014-09-17 NOTE — Anesthesia Procedure Notes (Signed)
Procedure Name: LMA Insertion Performed by: Maryella Shivers Pre-anesthesia Checklist: Patient identified, Emergency Drugs available, Suction available and Patient being monitored Patient Re-evaluated:Patient Re-evaluated prior to inductionOxygen Delivery Method: Circle System Utilized Preoxygenation: Pre-oxygenation with 100% oxygen Intubation Type: IV induction Ventilation: Mask ventilation without difficulty LMA: LMA flexible inserted LMA Size: 4.0 Number of attempts: 1 Airway Equipment and Method: Bite block Placement Confirmation: positive ETCO2 Tube secured with: Tape Dental Injury: Teeth and Oropharynx as per pre-operative assessment

## 2014-09-17 NOTE — Discharge Instructions (Signed)
1. No bending or stooping of head. 2. Cool compresses to both eyes intermittently. 3. Percocet 5/325 mg tabs 1-2 tabs po q 4-6 hours prn pain- prescription given in office. 4. Keflex po qid- prescription given in office. 5. Sterapred dose pack as directed- prescription given in office. 6. Follow-up appointment tomorrow in office.   Post Anesthesia Home Care Instructions  Activity: Get plenty of rest for the remainder of the day. A responsible adult should stay with you for 24 hours following the procedure.  For the next 24 hours, DO NOT: -Drive a car -Paediatric nurse -Drink alcoholic beverages -Take any medication unless instructed by your physician -Make any legal decisions or sign important papers.  Meals: Start with liquid foods such as gelatin or soup. Progress to regular foods as tolerated. Avoid greasy, spicy, heavy foods. If nausea and/or vomiting occur, drink only clear liquids until the nausea and/or vomiting subsides. Call your physician if vomiting continues.  Special Instructions/Symptoms: Your throat may feel dry or sore from the anesthesia or the breathing tube placed in your throat during surgery. If this causes discomfort, gargle with warm salt water. The discomfort should disappear within 24 hours.

## 2014-09-17 NOTE — Anesthesia Preprocedure Evaluation (Signed)
Anesthesia Evaluation  Patient identified by MRN, date of birth, ID band Patient awake    Reviewed: Allergy & Precautions, NPO status , Patient's Chart, lab work & pertinent test results  Airway Mallampati: I  TM Distance: >3 FB Neck ROM: Full    Dental  (+) Teeth Intact, Dental Advisory Given   Pulmonary  breath sounds clear to auscultation        Cardiovascular Rhythm:Regular Rate:Normal     Neuro/Psych    GI/Hepatic   Endo/Other    Renal/GU      Musculoskeletal   Abdominal   Peds  Hematology   Anesthesia Other Findings   Reproductive/Obstetrics                             Anesthesia Physical Anesthesia Plan  ASA: I  Anesthesia Plan: General   Post-op Pain Management:    Induction: Intravenous  Airway Management Planned: LMA and Oral ETT  Additional Equipment:   Intra-op Plan:   Post-operative Plan: Extubation in OR  Informed Consent: I have reviewed the patients History and Physical, chart, labs and discussed the procedure including the risks, benefits and alternatives for the proposed anesthesia with the patient or authorized representative who has indicated his/her understanding and acceptance.   Dental advisory given  Plan Discussed with: CRNA, Anesthesiologist and Surgeon  Anesthesia Plan Comments:         Anesthesia Quick Evaluation

## 2014-09-17 NOTE — H&P (Signed)
  H&P faxed to surgical center.  -History and Physical Reviewed  -Patient has been re-examined  -No change in the plan of care  Rebecca Chapman    

## 2014-09-17 NOTE — Anesthesia Postprocedure Evaluation (Signed)
Anesthesia Post Note  Patient: Rebecca Chapman  Procedure(s) Performed: Procedure(s) (LRB): BILATERAL UPPER BLEPHAROPLASTY (Bilateral)  Anesthesia type: General  Patient location: PACU  Post pain: Pain level controlled and Adequate analgesia  Post assessment: Post-op Vital signs reviewed, Patient's Cardiovascular Status Stable, Respiratory Function Stable, Patent Airway and Pain level controlled  Last Vitals:  Filed Vitals:   09/17/14 1700  BP: 129/77  Pulse: 85  Temp:   Resp: 13    Post vital signs: Reviewed and stable  Level of consciousness: awake, alert  and oriented  Complications: No apparent anesthesia complications

## 2014-09-18 ENCOUNTER — Encounter (HOSPITAL_BASED_OUTPATIENT_CLINIC_OR_DEPARTMENT_OTHER): Payer: Self-pay | Admitting: Plastic Surgery

## 2014-10-22 NOTE — Op Note (Signed)
NAMEJENETTE, Rebecca Chapman              ACCOUNT NO.:  [id number]  MEDICAL RECORD NO.:  [id number]  LOCATION:                                 FACILITY:  PHYSICIAN:  Hetty Blend, M.D.DATE OF BIRTH:  Oct 21, 1955  DATE OF PROCEDURE: DATE OF DISCHARGE:                              OPERATIVE REPORT   PREOPERATIVE DIAGNOSIS:  Bilateral upper dermatochalasis.  POSTOPERATIVE DIAGNOSIS:  Bilateral upper dermatochalasis.  PROCEDURE:  Bilateral upper blepharoplasties.  ATTENDING SURGEON:  Hetty Blend, M.D.  ANESTHESIA:  General.  ANESTHESIOLOGIST:  Albertha Ghee, MD  ESTIMATED BLOOD LOSS:  Minimal.  COMPLICATIONS:  None.  INDICATIONS FOR THE PROCEDURE:  The patient is a 59 year old Caucasian female who has bilateral upper eyelid dermatochalasis that does cause some obstruction of her visual fields.  The extra skin also makes her eyelids feel "heavy."  She presents to undergo bilateral upper blepharoplasties.  PROCEDURE:  The patient was marked in the preop holding area for the bilateral upper blepharoplasties.  She was then taken back to the OR, placed on the table in a supine position.  After adequate general anesthesia was obtained, the patient's face was prepped with Betadine and draped in sterile fashion.  The markings for the blepharoplasty skin excisions were checked and found to be appropriate.  Both of the upper blepharoplasties were performed in the following similar manner.  The upper eyelid soft tissue was infiltrated with 1% lidocaine with epinephrine in the area of the skin that was to be excised.  After adequate hemostasis had taken effect, the procedure was begun.  Using a 15-blade knife, incisions were made as marked to remove the excess upper eyelid skin.  Next, a thin strip of orbicularis oculi muscle was removed.  This allowed exposure to the orbital septum and fat pads.  There was excess fat that needed to be removed from the medial and middle fat pads  of the upper eyelid.  The fat pads were gently dissected out and excess fat was excised, being careful to obtain excellent hemostasis with the excision of the fat.  The globe was carefully balloted, and there did not appear to be any excess fat remaining.  Of course, the excess fat was removed, however, some still needed to remain in order not to skeletonize the eyelids.  Meticulous hemostasis was obtained with Bovie electrocautery.  The skin incision was then closed using 4-0 Prolene in a running intracuticular suture bilaterally.  Steri-Strips were applied for dressing.  There were no complications.  The patient tolerated the procedure well.  The final needle and sponge counts were reported to be correct at the end of the case.  The patient was then awakened from the general anesthesia and taken to the PACU in stable condition.  The patient was recovered without complications.  Both the patient and her family were given proper postoperative wound care instructions.  DISCHARGE INSTRUCTIONS:  The patient was then discharged home in the care of her husband in stable condition.  Followup appointment will be tomorrow in the office.          ______________________________ Hetty Blend, M.D.     MC/MEDQ  D:  10/22/2014  T:  10/22/2014  Job:  [id number]  cc:   Hetty Blend, M.D.

## 2015-02-24 ENCOUNTER — Other Ambulatory Visit: Payer: Self-pay

## 2015-02-24 DIAGNOSIS — Z1231 Encounter for screening mammogram for malignant neoplasm of breast: Secondary | ICD-10-CM

## 2015-04-07 ENCOUNTER — Ambulatory Visit: Payer: BC Managed Care – PPO

## 2015-05-07 ENCOUNTER — Ambulatory Visit
Admission: RE | Admit: 2015-05-07 | Discharge: 2015-05-07 | Disposition: A | Discharge status: Home / Self Care | Payer: BC Managed Care – PPO | Source: Ambulatory Visit

## 2015-05-07 DIAGNOSIS — Z1231 Encounter for screening mammogram for malignant neoplasm of breast: Secondary | ICD-10-CM

## 2016-04-10 ENCOUNTER — Other Ambulatory Visit: Payer: Self-pay | Admitting: Internal Medicine

## 2016-04-10 DIAGNOSIS — Z1231 Encounter for screening mammogram for malignant neoplasm of breast: Secondary | ICD-10-CM

## 2016-05-08 ENCOUNTER — Ambulatory Visit
Admission: RE | Admit: 2016-05-08 | Discharge: 2016-05-08 | Disposition: A | Discharge status: Home / Self Care | Payer: BC Managed Care – PPO | Source: Ambulatory Visit | Attending: Internal Medicine | Admitting: Internal Medicine

## 2016-05-08 DIAGNOSIS — Z1231 Encounter for screening mammogram for malignant neoplasm of breast: Secondary | ICD-10-CM

## 2017-04-09 ENCOUNTER — Other Ambulatory Visit: Payer: Self-pay | Admitting: Internal Medicine

## 2017-04-09 DIAGNOSIS — Z1231 Encounter for screening mammogram for malignant neoplasm of breast: Secondary | ICD-10-CM

## 2017-05-14 ENCOUNTER — Ambulatory Visit
Admission: RE | Admit: 2017-05-14 | Discharge: 2017-05-14 | Disposition: A | Discharge status: Home / Self Care | Payer: BC Managed Care – PPO | Source: Ambulatory Visit | Attending: Internal Medicine | Admitting: Internal Medicine

## 2017-05-14 DIAGNOSIS — Z1231 Encounter for screening mammogram for malignant neoplasm of breast: Secondary | ICD-10-CM

## 2017-08-10 ENCOUNTER — Encounter: Payer: Self-pay | Admitting: Internal Medicine

## 2017-09-28 ENCOUNTER — Ambulatory Visit (AMBULATORY_SURGERY_CENTER): Payer: Self-pay

## 2017-09-28 ENCOUNTER — Encounter: Payer: Self-pay | Admitting: Internal Medicine

## 2017-09-28 ENCOUNTER — Other Ambulatory Visit: Payer: Self-pay

## 2017-09-28 VITALS — Ht 66.0 in | Wt 120.8 lb

## 2017-09-28 DIAGNOSIS — Z1211 Encounter for screening for malignant neoplasm of colon: Secondary | ICD-10-CM

## 2017-09-28 NOTE — Progress Notes (Signed)
No egg or soy allergy known to patient  No issues with past sedation with any surgeries  or procedures, no intubation problems  No diet pills per patient No home 02 use per patient  No blood thinners per patient  Pt denies issues with constipation  No A fib or A flutter  EMMI video sent to pt's e mail pt declined   

## 2017-10-09 ENCOUNTER — Encounter: Payer: BC Managed Care – PPO | Admitting: Internal Medicine

## 2017-10-12 DIAGNOSIS — S060XAA Concussion with loss of consciousness status unknown, initial encounter: Secondary | ICD-10-CM

## 2017-10-12 DIAGNOSIS — S060X9A Concussion with loss of consciousness of unspecified duration, initial encounter: Secondary | ICD-10-CM

## 2017-10-12 HISTORY — DX: Concussion with loss of consciousness of unspecified duration, initial encounter: S06.0X9A

## 2017-10-12 HISTORY — DX: Concussion with loss of consciousness status unknown, initial encounter: S06.0XAA

## 2017-12-06 ENCOUNTER — Encounter: Payer: Self-pay | Admitting: Internal Medicine

## 2018-04-09 ENCOUNTER — Encounter: Payer: Self-pay | Admitting: Internal Medicine

## 2018-04-24 ENCOUNTER — Other Ambulatory Visit: Payer: Self-pay | Admitting: Internal Medicine

## 2018-04-24 DIAGNOSIS — Z1231 Encounter for screening mammogram for malignant neoplasm of breast: Secondary | ICD-10-CM

## 2018-05-22 ENCOUNTER — Ambulatory Visit
Admission: RE | Admit: 2018-05-22 | Discharge: 2018-05-22 | Disposition: A | Discharge status: Home / Self Care | Payer: BC Managed Care – PPO | Source: Ambulatory Visit | Attending: Internal Medicine | Admitting: Internal Medicine

## 2018-05-22 DIAGNOSIS — Z1231 Encounter for screening mammogram for malignant neoplasm of breast: Secondary | ICD-10-CM

## 2018-05-30 ENCOUNTER — Encounter: Payer: Self-pay | Admitting: Internal Medicine

## 2018-05-30 ENCOUNTER — Ambulatory Visit (AMBULATORY_SURGERY_CENTER): Payer: Self-pay

## 2018-05-30 ENCOUNTER — Other Ambulatory Visit: Payer: Self-pay

## 2018-05-30 VITALS — Ht 65.75 in | Wt 123.4 lb

## 2018-05-30 DIAGNOSIS — Z1211 Encounter for screening for malignant neoplasm of colon: Secondary | ICD-10-CM

## 2018-05-30 NOTE — Progress Notes (Signed)
No egg or soy allergy known to patient  No issues with past sedation with any surgeries  or procedures, no intubation problems  No diet pills per patient No home 02 use per patient  No blood thinners per patient  Pt denies issues with constipation  No A fib or A flutter  EMMI video sent to pt's e mail , pt declined    

## 2018-06-13 ENCOUNTER — Encounter: Payer: Self-pay | Admitting: Internal Medicine

## 2018-06-13 ENCOUNTER — Ambulatory Visit (AMBULATORY_SURGERY_CENTER): Payer: BC Managed Care – PPO | Admitting: Internal Medicine

## 2018-06-13 VITALS — BP 116/66 | HR 47 | Temp 98.7°F | Resp 11 | Ht 66.0 in | Wt 118.0 lb

## 2018-06-13 DIAGNOSIS — D122 Benign neoplasm of ascending colon: Secondary | ICD-10-CM

## 2018-06-13 DIAGNOSIS — K635 Polyp of colon: Secondary | ICD-10-CM

## 2018-06-13 DIAGNOSIS — Z1211 Encounter for screening for malignant neoplasm of colon: Secondary | ICD-10-CM | POA: Diagnosis not present

## 2018-06-13 MED ORDER — SODIUM CHLORIDE 0.9 % IV SOLN: 500.0000 mL | Freq: Once | INTRAVENOUS | Status: DC

## 2018-06-13 NOTE — Progress Notes (Signed)
Pt's states no medical or surgical changes since previsit or office visit. 

## 2018-06-13 NOTE — Progress Notes (Signed)
Called to room to assist during endoscopic procedure.  Patient ID and intended procedure confirmed with present staff. Received instructions for my participation in the procedure from the performing physician.  

## 2018-06-13 NOTE — Op Note (Signed)
[facility] Patient Name: Rebecca Chapman Procedure Date: 06/13/2018 11:42 AM MRN: [id number] Endoscopist: Gatha Mayer , MD Age: 62 Referring MD:  Date of Birth: [birth year] Gender: Female Account #: [id number] Procedure:                Colonoscopy Indications:              Screening for colorectal malignant neoplasm, Last                            colonoscopy: 2009 Medicines:                Propofol per Anesthesia, Monitored Anesthesia Care Procedure:                Pre-Anesthesia Assessment:                           - Prior to the procedure, a History and Physical                            was performed, and patient medications and                            allergies were reviewed. The patient's tolerance of                            previous anesthesia was also reviewed. The risks                            and benefits of the procedure and the sedation                            options and risks were discussed with the patient.                            All questions were answered, and informed consent                            was obtained. Prior Anticoagulants: The patient has                            taken no previous anticoagulant or antiplatelet                            agents. ASA Grade Assessment: II - A patient with                            mild systemic disease. After reviewing the risks                            and benefits, the patient was deemed in                            satisfactory condition to undergo the procedure.  After obtaining informed consent, the colonoscope                            was passed under direct vision. Throughout the                            procedure, the patient's blood pressure, pulse, and                            oxygen saturations were monitored continuously. The                            Colonoscope was introduced through the anus and                            advanced to the  the cecum, identified by                            appendiceal orifice and ileocecal valve. The                            colonoscopy was performed without difficulty. The                            patient tolerated the procedure well. The quality                            of the bowel preparation was good. The bowel                            preparation used was Miralax. The ileocecal valve,                            appendiceal orifice, and rectum were photographed. Scope In: 11:46:27 AM Scope Out: 12:03:23 PM Scope Withdrawal Time: 0 hours 11 minutes 21 seconds  Total Procedure Duration: 0 hours 16 minutes 56 seconds  Findings:                 The perianal and digital rectal examinations were                            normal.                           A 1 to 2 mm polyp was found in the ascending colon.                            The polyp was sessile. The polyp was removed with a                            cold biopsy forceps. Resection and retrieval were                            complete. Verification of patient identification  for the specimen was done. Estimated blood loss was                            minimal.                           Multiple diverticula were found in the sigmoid                            colon.                           The exam was otherwise without abnormality on                            direct and retroflexion views. Complications:            No immediate complications. Estimated Blood Loss:     Estimated blood loss was minimal. Impression:               - One 1 to 2 mm polyp in the ascending colon,                            removed with a cold biopsy forceps. Resected and                            retrieved.                           - Diverticulosis in the sigmoid colon. Mild                            stenosis associated.                           - The examination was otherwise normal on direct                             and retroflexion views. Recommendation:           - Patient has a contact number available for                            emergencies. The signs and symptoms of potential                            delayed complications were discussed with the                            patient. Return to normal activities tomorrow.                            Written discharge instructions were provided to the                            patient.                           -  Resume previous diet.                           - Continue present medications.                           - Repeat colonoscopy is recommended. The                            colonoscopy date will be determined after pathology                            results from today's exam become available for                            review. Gatha Mayer, MD 06/13/2018 12:09:20 PM This report has been signed electronically.

## 2018-06-13 NOTE — Progress Notes (Signed)
PT taken to PACU. Monitors in place. VSS. Report given to RN. 

## 2018-06-13 NOTE — Patient Instructions (Addendum)
I found and removed one tiny polyp.  I will let you know pathology results and when to have another routine colonoscopy by mail and/or My Chart.   You also have a condition called diverticulosis - common and not usually a problem. Please read the handout provided.  I appreciate the opportunity to care for you. Gatha Mayer, MD, [facility]  ** Handout given on polyps and diverticulosis **    YOU HAD AN ENDOSCOPIC PROCEDURE TODAY AT [place]:   Refer to the procedure report that was given to you for any specific questions about what was found during the examination.  If the procedure report does not answer your questions, please call your gastroenterologist to clarify.  If you requested that your care partner not be given the details of your procedure findings, then the procedure report has been included in a sealed envelope for you to review at your convenience later.  YOU SHOULD EXPECT: Some feelings of bloating in the abdomen. Passage of more gas than usual.  Walking can help get rid of the air that was put into your GI tract during the procedure and reduce the bloating. If you had a lower endoscopy (such as a colonoscopy or flexible sigmoidoscopy) you may notice spotting of blood in your stool or on the toilet paper. If you underwent a bowel prep for your procedure, you may not have a normal bowel movement for a few days.  Please Note:  You might notice some irritation and congestion in your nose or some drainage.  This is from the oxygen used during your procedure.  There is no need for concern and it should clear up in a day or so.  SYMPTOMS TO REPORT IMMEDIATELY:   Following lower endoscopy (colonoscopy or flexible sigmoidoscopy):  Excessive amounts of blood in the stool  Significant tenderness or worsening of abdominal pains  Swelling of the abdomen that is new, acute  Fever of 100F or higher  For urgent or emergent issues, a gastroenterologist can be  reached at any hour by calling [phone].   DIET:  We do recommend a small meal at first, but then you may proceed to your regular diet.  Drink plenty of fluids but you should avoid alcoholic beverages for 24 hours.  ACTIVITY:  You should plan to take it easy for the rest of today and you should NOT DRIVE or use heavy machinery until tomorrow (because of the sedation medicines used during the test).    FOLLOW UP: Our staff will call the number listed on your records the next business day following your procedure to check on you and address any questions or concerns that you may have regarding the information given to you following your procedure. If we do not reach you, we will leave a message.  However, if you are feeling well and you are not experiencing any problems, there is no need to return our call.  We will assume that you have returned to your regular daily activities without incident.  If any biopsies were taken you will be contacted by phone or by letter within the next 1-3 weeks.  Please call us at [phone] if you have not heard about the biopsies in 3 weeks.    SIGNATURES/CONFIDENTIALITY: You and/or your care partner have signed paperwork which will be entered into your electronic medical record.  These signatures attest to the fact that that the information above on your After Visit Summary has been reviewed and  is understood.  Full responsibility of the confidentiality of this discharge information lies with you and/or your care-partner.

## 2018-06-14 ENCOUNTER — Telehealth: Payer: Self-pay | Admitting: *Deleted

## 2018-06-14 NOTE — Telephone Encounter (Signed)
  Follow up Call-  Call back number 06/13/2018  Post procedure Call Back phone  # [phone]  Permission to leave phone message Yes  Some recent data might be hidden     Patient questions:  Do you have a fever, pain , or abdominal swelling? No. Pain Score  0 *  Have you tolerated food without any problems? Yes.    Have you been able to return to your normal activities? yes  Do you have any questions about your discharge instructions: Diet   No. Medications  No. Follow up visit  No.  Do you have questions or concerns about your Care? No.  Actions: * If pain score is 4 or above: No action needed, pain <4.

## 2018-06-27 ENCOUNTER — Encounter: Payer: Self-pay | Admitting: Internal Medicine

## 2018-06-27 DIAGNOSIS — Z86010 Personal history of colonic polyps: Secondary | ICD-10-CM

## 2018-06-27 DIAGNOSIS — Z860101 Personal history of adenomatous and serrated colon polyps: Secondary | ICD-10-CM

## 2018-06-27 HISTORY — DX: Personal history of colonic polyps: Z86.010

## 2018-06-27 HISTORY — DX: Personal history of adenomatous and serrated colon polyps: Z86.0101

## 2018-06-27 NOTE — Progress Notes (Signed)
2 mm adenoma Recall 2026 My Chart

## 2019-04-09 ENCOUNTER — Other Ambulatory Visit: Payer: Self-pay | Admitting: Internal Medicine

## 2019-04-09 DIAGNOSIS — Z1231 Encounter for screening mammogram for malignant neoplasm of breast: Secondary | ICD-10-CM

## 2019-05-06 ENCOUNTER — Ambulatory Visit: Payer: 59 | Admitting: Psychology

## 2019-05-13 ENCOUNTER — Ambulatory Visit: Payer: 59 | Admitting: Psychology

## 2019-05-20 ENCOUNTER — Ambulatory Visit: Payer: 59 | Admitting: Psychology

## 2019-05-26 ENCOUNTER — Other Ambulatory Visit: Payer: Self-pay

## 2019-05-26 ENCOUNTER — Ambulatory Visit
Admission: RE | Admit: 2019-05-26 | Discharge: 2019-05-26 | Disposition: A | Discharge status: Home / Self Care | Payer: 59 | Source: Ambulatory Visit | Attending: Internal Medicine | Admitting: Internal Medicine

## 2019-05-26 DIAGNOSIS — Z1231 Encounter for screening mammogram for malignant neoplasm of breast: Secondary | ICD-10-CM

## 2019-08-13 ENCOUNTER — Ambulatory Visit: Payer: 59 | Attending: Internal Medicine

## 2019-08-13 DIAGNOSIS — Z20822 Contact with and (suspected) exposure to covid-19: Secondary | ICD-10-CM

## 2019-08-15 LAB — NOVEL CORONAVIRUS, NAA: SARS-CoV-2, NAA: NOT DETECTED

## 2020-02-23 ENCOUNTER — Ambulatory Visit: Payer: 59 | Admitting: Neurology

## 2020-02-23 ENCOUNTER — Encounter: Payer: Self-pay | Admitting: Neurology

## 2020-02-23 ENCOUNTER — Other Ambulatory Visit: Payer: Self-pay

## 2020-02-23 VITALS — BP 143/80 | HR 71 | Ht 66.0 in | Wt 114.0 lb

## 2020-02-23 DIAGNOSIS — R4189 Other symptoms and signs involving cognitive functions and awareness: Secondary | ICD-10-CM | POA: Diagnosis not present

## 2020-02-23 DIAGNOSIS — F419 Anxiety disorder, unspecified: Secondary | ICD-10-CM

## 2020-02-23 NOTE — Patient Instructions (Addendum)
Rebecca Chapman - [facility]  Blood work   Options to consider:  Therapy/insomnia counseling Limit alcohol Repeat MRI brain Formal memory testing Read the XX brain (book provided)  Memory Compensation Strategies  1. Use "WARM" strategy.  W= write it down  A= associate it  R= repeat it  M= make a mental note  2.   You can keep a Social worker.  Use a 3-ring notebook with sections for the following: calendar, important names and phone numbers,  medications, doctors' names/phone numbers, lists/reminders, and a section to journal what you did  each day.   3.    Use a calendar to write appointments down.  4.    Write yourself a schedule for the day.  This can be placed on the calendar or in a separate section of the Memory Notebook.  Keeping a  regular schedule can help memory.  5.    Use medication organizer with sections for each day or morning/evening pills.  You may need help loading it  6.    Keep a basket, or pegboard by the door.  Place items that you need to take out with you in the basket or on the pegboard.  You may also want to  include a message board for reminders.  7.    Use sticky notes.  Place sticky notes with reminders in a place where the task is performed.  For example: " turn off the  stove" placed by the stove, "lock the door" placed on the door at eye level, " take your medications" on  the bathroom mirror or by the place where you normally take your medications.  8.    Use alarms/timers.  Use while cooking to remind yourself to check on food or as a reminder to take your medicine, or as a  reminder to make a call, or as a reminder to perform another task, etc.

## 2020-02-23 NOTE — Progress Notes (Signed)
[facility]    Provider:  Dr Jaynee Eagles Requesting Provider: Shon Baton, MD Primary Care Provider:  Shon Baton, MD  CC:  Memory loss - headaches are no longer a problem per patient  HPI:  Rebecca [facility] is a 64 y.o. female here as requested by Shon Baton, MD for headaches.  I reviewed Dr. Keane Police notes: PMHx former smoker half packs per day she quit in 1990, 2 caffeine drinks a day, she does exercise 5 times a week, she drinks wine, 2-3 times a week, only 1-2 at a time, insomnia, fibromyalgia, situational anxiety, concussion March 2019, she has a history of MS and her mom who suicide 16, lupus and her sister with breast cancer, healthy brother, she works at [employer], she has recurrent headaches, she is had them in the past and had to go see neurology but they subsided, thinks it might be stress related, her last MRI was January 20 16th, she is on a vegan plant-based diet with occasional fish, she is getting B12, vitamin D and a multivitamin, headaches are similar to the past Tylenol 500 mg helps, she has a long history of insomnia with benzos at night, tried trazodone, Benadryl, Belsomra and zolpidem and Xanax, in the past she took an SSRI and when the headache started she stopped them, no correlation, she does also have a history of asthma, osteopenia, she did have an autoimmune work-up in the past here and a second opinion at weight, nortriptyline did not help, she does have white matter ischemic changes in her brain likely chronic microvascular ischemic change, the MRI in 2016 had not progressed as compared to the previous study in September 2013.  She is on citalopram 20 mg a day.  Patient says she was referred for headaches but those are no longer a problem and is here for memory. Headaches resolved. She no longer wants to talk about headaches. She is off of her Citalopram because she felt foggy. She wants to talk about memory, she is a type A  personality so she is hard on herself, she is used to going 100 miles a hour, she can't perform as well anymore. She has a lot of anxiety. Forget people's names then she remembers them a few minutes ago. She has been told she has white matter changes (we reviewed together and they are extremely mild), she has had a lot of stress in her lifetime, she tries to give herself a break, she does not work not because of any memory concerns she was caring for her dad and decided not to work anymore. Her step mother has Alzheimer's and she is afraid of that, she rescued a horse and she is doing well, no FHx of dementia. Other people around her re noticing memory changes, she did have a concussion in 2019 she hit her head on the pavement, they feel she trips more but no falls. She is performing all of her ADLs, IADLs, she helped her son get into medical school. She is very active. She feel good. She just notices she walks in rooms and forgets what she is there for. She sleeps well, she limits her alcohol, stops smoking, manages all her risk factors.   Reviewed notes, labs and imaging from outside physicians, which showed:  I personally reviewed MRI images and agree with the following report, I would add the chronic microvascular changes there mild/quite minimal for age and medical conditions such as migraines were we can see an increase in  and identified white matter changes such as these:  MRI HEAD WITHOUT CONTRAST  TECHNIQUE: Multiplanar, multiecho pulse sequences of the brain and surrounding structures were obtained without intravenous contrast.  COMPARISON:  MR brain performed at [facility] 04/17/2012.  FINDINGS: No evidence for acute infarction, hemorrhage, mass lesion, hydrocephalus, or extra-axial fluid. Borderline cerebral and cerebellar atrophy.  Mild subcortical and periventricular T2 and FLAIR hyperintensities, likely chronic microvascular ischemic change.  Flow voids are maintained  throughout the carotid, basilar, and vertebral arteries. There are no areas of chronic hemorrhage. Pituitary, pineal, and cerebellar tonsils unremarkable. No upper cervical lesions. Visualized calvarium, skull base, and upper cervical osseous structures unremarkable. Scalp and extracranial soft tissues, orbits, sinuses, and mastoids show no acute process.  Compared with priors, there is a similar appearance to the foci of suspected small vessel disease as observed on FLAIR imaging. Insert largely subcortical in location, and do not appear worrisome for demyelinating disease. Other considerations, such as chronic complicated migraine, chronic infection, or vasculitis, are less favored.  IMPRESSION: Mild subcortical and periventricular T2 and FLAIR hyperintensities, likely chronic microvascular ischemic change. These are similar in appearance to the previous study 04/17/2012. See discussion above.  Review of Systems: Patient complains of symptoms per HPI as well as the following symptoms: memory changes  Pertinent negatives and positives per HPI. All others negative.   Social History   Socioeconomic History  . Marital status: Married    Spouse name: Not on file  . Number of children: 2  . Years of education: Not on file  . Highest education level: Bachelor's degree (e.g., BA, AB, BS)  Occupational History  . Not on file  Tobacco Use  . Smoking status: Former Smoker    Packs/day: 0.50    Start date: 76    Quit date: 1990    Years since quitting: 31.5  . Smokeless tobacco: Never Used  . Tobacco comment: hx of smoking for about 2 years in her 4's  Vaping Use  . Vaping Use: Never used  Substance and Sexual Activity  . Alcohol use: Yes    Alcohol/week: 14.0 standard drinks    Types: 14 Glasses of wine per week    Comment: 2 glasses of wine with dinner  . Drug use: No  . Sexual activity: Not on file  Other Topics Concern  . Not on file  Social History Narrative    Lives at home with husband   Right handed   Caffeine: 1 cup/day   Social Determinants of Health   Financial Resource Strain:   . Difficulty of Paying Living Expenses:   Food Insecurity:   . Worried About Charity fundraiser in the Last Year:   . Arboriculturist in the Last Year:   Transportation Needs:   . Film/video editor (Medical):   Marland Kitchen Lack of Transportation (Non-Medical):   Physical Activity:   . Days of Exercise per Week:   . Minutes of Exercise per Session:   Stress:   . Feeling of Stress :   Social Connections:   . Frequency of Communication with Friends and Family:   . Frequency of Social Gatherings with Friends and Family:   . Attends Religious Services:   . Active Member of Clubs or Organizations:   . Attends Archivist Meetings:   Marland Kitchen Marital Status:   Intimate Partner Violence:   . Fear of Current or Ex-Partner:   . Emotionally Abused:   Marland Kitchen Physically Abused:   .  Sexually Abused:     Family History  Problem Relation Age of Onset  . Heart disease Father   . Multiple sclerosis Mother   . Suicidality Mother   . Breast cancer Sister 56  . Lupus Sister   . Other Sister        hyponatremia, findings on white matter in pons   . Colon cancer Neg Hx   . Colon polyps Neg Hx   . Esophageal cancer Neg Hx   . Rectal cancer Neg Hx   . Stomach cancer Neg Hx     Past Medical History:  Diagnosis Date  . Acute ischemic colitis ([place]) 07/11/2012  . Anxiety   . Blood transfusion without reported diagnosis    1997 due to childbirth  . Concussion 10/2017  . Diverticulosis   . Fibromyalgia   . Heart murmur   . History of colon polyps    Hyperplastic   . Hx of adenomatous polyp of colon 06/27/2018  . Insomnia   . Situational anxiety   . Wears contact lenses     Patient Active Problem List   Diagnosis Date Noted  . Hx of adenomatous polyp of colon 06/27/2018  . IBS (irritable bowel syndrome) 07/29/2012    Past Surgical History:  Procedure  Laterality Date  . BROW LIFT Bilateral 09/17/2014   Procedure: BILATERAL UPPER BLEPHAROPLASTY;  Surgeon: Charlene Brooke, MD;  Location: [place];  Service: Plastics;  Laterality: Bilateral;  . CATARACT EXTRACTION Bilateral    Feb 2021 and May 2021  . CESAREAN SECTION     x2  . COLONOSCOPY W/ BIOPSIES  09/20/07  . DILATION AND CURETTAGE OF UTERUS     x3  . HYSTEROTOMY    . TONSILECTOMY, ADENOIDECTOMY, BILATERAL MYRINGOTOMY AND TUBES     pt doesn't think she had tubes placed, maybe as a child?  . TOTAL ABDOMINAL HYSTERECTOMY W/ BILATERAL SALPINGOOPHORECTOMY  2007  . WISDOM TOOTH EXTRACTION      Current Outpatient Medications  Medication Sig Dispense Refill  . ALPRAZolam (XANAX) 0.5 MG tablet TK 1 T PO  HS PRN  1  . Multiple Vitamin (MULTIVITAMIN) tablet Take 1 tablet by mouth daily.     No current facility-administered medications for this visit.    Allergies as of 02/23/2020 - Review Complete 02/23/2020  Allergen Reaction Noted  . Other  07/11/2012  . Sulfa antibiotics Rash 07/11/2012    Vitals: BP (!) 143/80 (BP Location: Right Arm, Patient Position: Sitting)   Pulse 71   Ht 5\' 6"  (1.676 m)   Wt 114 lb (51.7 kg)   BMI 18.40 kg/m  Last Weight:  Wt Readings from Last 1 Encounters:  02/23/20 114 lb (51.7 kg)   Last Height:   Ht Readings from Last 1 Encounters:  02/23/20 5\' 6"  (1.676 m)     Physical exam: Exam: Gen: NAD, conversant, well nourised, well groomed                     CV: RRR, no MRG. No Carotid Bruits. No peripheral edema, warm, nontender Eyes: Conjunctivae clear without exudates or hemorrhage  Neuro: Detailed Neurologic Exam  Speech:    Speech is normal; fluent and spontaneous with normal comprehension.  Cognition:    The patient is oriented to person, place, and time;     recent and remote memory intact;     language fluent;     normal attention, concentration,     fund of knowledge Cranial  Nerves:    The pupils are equal,  round, and reactive to light. The fundi are flat.  Visual fields are full to finger confrontation. Extraocular movements are intact. Trigeminal sensation is intact and the muscles of mastication are normal. The face is symmetric. The palate elevates in the midline. Hearing intact. Voice is normal. Shoulder shrug is normal. The tongue has normal motion without fasciculations.   Coordination:    No dysmetria or ataxia   Gait:   Normal native gait  Motor Observation:    No asymmetry, no atrophy, and no involuntary movements noted. Tone:    Normal muscle tone.    Posture:    Posture is normal. normal erect    Strength:    Strength is V/V in the upper and lower limbs.      Sensation: intact to LT     Reflex Exam:  DTR's:    Deep tendon reflexes in the upper and lower extremities are normal bilaterally.   Toes:    The toes are downgoing bilaterally.   Clonus:    Clonus is absent.    Assessment/Plan:   64 y.o. female here as requested by Shon Baton, MD for headaches.  I reviewed Dr. Keane Police notes: PMHx former smoker half packs per day she quit in 1990, 2 caffeine drinks a day, she does exercise 5 times a week, she drinks wine, 2-3 times a week, only 1-2 at a time, insomnia, fibromyalgia, situational anxiety, concussion March 2019, she has a history of MS and her mom who suicide 34, lupus and her sister with breast cancer, healthy brother.  -This is a really lovely patient, I do not appreciate any sort of neurodegenerative issues with patient, she was originally sent here for headaches but she said those have resolved and she is very much worried about her memory, I think there is possibly a combination of anxiety and normal cognitive aging going on here. We had a really long discussion, we discussed white matter changes the brain of which hers are incredibly mild and her brain is normal for age if not better,  Eino Far[facility] - [facility]  Blood work   Options to  consider:  Therapy/insomnia counseling Limit alcohol Repeat MRI brain Formal memory testing Read the XX brain (book provided)  Orders Placed This Encounter  Procedures  . B12 and Folate Panel  . Methylmalonic acid, serum  . Vitamin B1  . Homocysteine  . TSH  . Comprehensive metabolic panel  . CBC with Differential/Platelets     Cc: Shon Baton, MD,    Sarina Ill, MD  [facility] [address] [place], [place] [address]  Phone [phone] Fax [phone]

## 2020-02-26 ENCOUNTER — Telehealth: Payer: Self-pay | Admitting: Neurology

## 2020-02-26 LAB — CBC WITH DIFFERENTIAL/PLATELET
Basophils Absolute: 0 10*3/uL (ref 0.0–0.2)
Basos: 1 %
EOS (ABSOLUTE): 0 10*3/uL (ref 0.0–0.4)
Eos: 1 %
Hematocrit: 40.9 % (ref 34.0–46.6)
Hemoglobin: 13.1 g/dL (ref 11.1–15.9)
Immature Grans (Abs): 0 10*3/uL (ref 0.0–0.1)
Immature Granulocytes: 1 %
Lymphocytes Absolute: 1.2 10*3/uL (ref 0.7–3.1)
Lymphs: 27 %
MCH: 30.7 pg (ref 26.6–33.0)
MCHC: 32 g/dL (ref 31.5–35.7)
MCV: 96 fL (ref 79–97)
Monocytes Absolute: 0.4 10*3/uL (ref 0.1–0.9)
Monocytes: 9 %
Neutrophils Absolute: 2.7 10*3/uL (ref 1.4–7.0)
Neutrophils: 61 %
Platelets: 191 10*3/uL (ref 150–450)
RBC: 4.27 x10E6/uL (ref 3.77–5.28)
RDW: 12.6 % (ref 11.7–15.4)
WBC: 4.3 10*3/uL (ref 3.4–10.8)

## 2020-02-26 LAB — COMPREHENSIVE METABOLIC PANEL
ALT: 11 IU/L (ref 0–32)
AST: 23 IU/L (ref 0–40)
Albumin/Globulin Ratio: 2.1 (ref 1.2–2.2)
Albumin: 4.6 g/dL (ref 3.8–4.8)
Alkaline Phosphatase: 68 IU/L (ref 48–121)
BUN/Creatinine Ratio: 22 (ref 12–28)
BUN: 14 mg/dL (ref 8–27)
Bilirubin Total: 0.4 mg/dL (ref 0.0–1.2)
CO2: 24 mmol/L (ref 20–29)
Calcium: 9.7 mg/dL (ref 8.7–10.3)
Chloride: 102 mmol/L (ref 96–106)
Creatinine, Ser: 0.63 mg/dL (ref 0.57–1.00)
GFR calc Af Amer: 110 mL/min/{1.73_m2} (ref >59)
GFR calc non Af Amer: 96 mL/min/{1.73_m2} (ref >59)
Globulin, Total: 2.2 g/dL (ref 1.5–4.5)
Glucose: 89 mg/dL (ref 65–99)
Potassium: 4.6 mmol/L (ref 3.5–5.2)
Sodium: 140 mmol/L (ref 134–144)
Total Protein: 6.8 g/dL (ref 6.0–8.5)

## 2020-02-26 LAB — METHYLMALONIC ACID, SERUM: Methylmalonic Acid: 111 nmol/L (ref 0–378)

## 2020-02-26 LAB — VITAMIN B1: Thiamine: 137.9 nmol/L (ref 66.5–200.0)

## 2020-02-26 LAB — B12 AND FOLATE PANEL
Folate: 16.1 ng/mL (ref >3.0)
Vitamin B-12: 406 pg/mL (ref 232–1245)

## 2020-02-26 LAB — HOMOCYSTEINE: Homocysteine: 11.2 umol/L (ref 0.0–17.2)

## 2020-02-26 LAB — TSH: TSH: 1.53 u[IU]/mL (ref 0.450–4.500)

## 2020-02-26 NOTE — Telephone Encounter (Signed)
Pt has called to report that Dr Ysidro Evert office need a referral in order for pt to be seen

## 2020-02-29 NOTE — Addendum Note (Signed)
Addended by: Sarina Ill B on: 02/29/2020 06:01 PM   Modules accepted: Orders

## 2020-02-29 NOTE — Telephone Encounter (Signed)
Referral placed. thanks

## 2020-03-01 ENCOUNTER — Encounter: Payer: Self-pay | Admitting: *Deleted

## 2020-03-01 NOTE — Telephone Encounter (Signed)
Sent my chart to advise referral sent in.

## 2020-03-03 NOTE — Telephone Encounter (Signed)
Referral has been sent and patient aware.

## 2020-04-03 ENCOUNTER — Emergency Department (HOSPITAL_COMMUNITY): Payer: 59

## 2020-04-03 ENCOUNTER — Emergency Department (HOSPITAL_COMMUNITY)
Admission: EM | Admit: 2020-04-03 | Discharge: 2020-04-04 | Disposition: A | Discharge status: Home / Self Care | Payer: 59 | Attending: Emergency Medicine | Admitting: Emergency Medicine

## 2020-04-03 ENCOUNTER — Encounter (HOSPITAL_COMMUNITY): Payer: Self-pay | Admitting: *Deleted

## 2020-04-03 ENCOUNTER — Other Ambulatory Visit: Payer: Self-pay

## 2020-04-03 DIAGNOSIS — S0003XA Contusion of scalp, initial encounter: Secondary | ICD-10-CM | POA: Insufficient documentation

## 2020-04-03 DIAGNOSIS — Y999 Unspecified external cause status: Secondary | ICD-10-CM | POA: Diagnosis not present

## 2020-04-03 DIAGNOSIS — Y929 Unspecified place or not applicable: Secondary | ICD-10-CM | POA: Diagnosis not present

## 2020-04-03 DIAGNOSIS — Z5321 Procedure and treatment not carried out due to patient leaving prior to being seen by health care provider: Secondary | ICD-10-CM | POA: Insufficient documentation

## 2020-04-03 DIAGNOSIS — Y9389 Activity, other specified: Secondary | ICD-10-CM | POA: Diagnosis not present

## 2020-04-03 DIAGNOSIS — W1839XA Other fall on same level, initial encounter: Secondary | ICD-10-CM | POA: Diagnosis not present

## 2020-04-03 DIAGNOSIS — R4182 Altered mental status, unspecified: Secondary | ICD-10-CM | POA: Diagnosis present

## 2020-04-03 NOTE — ED Notes (Signed)
To ct

## 2020-04-03 NOTE — ED Triage Notes (Signed)
Pt discussed with dr j knapp he ordered c-ts

## 2020-04-03 NOTE — ED Triage Notes (Signed)
The pt arrived by [facility] from a stable in [place] she was found on the floor of the stable unresponsive  She had no idea of what occurred she was found on the ground.  Small hematoma to the back of her head abrasion she has no idea of what occurred  Alert and oriented with some thought.  Both pupils equal and react. Moves all extremities she has not spoken with her husband since the incident  Iv per ems

## 2020-04-03 NOTE — ED Notes (Signed)
Pt LWBS. Pt husband called her PCP and went over her blood work from today per [employer] Pt was encouraged to stay but decided to leave.

## 2020-04-05 ENCOUNTER — Telehealth: Payer: Self-pay

## 2020-04-05 NOTE — Telephone Encounter (Signed)
Pt left a VM asking for a call back to discuss an appt with Dr. Jaynee Eagles to discuss her ED visit on 04/03/2020.

## 2020-04-06 NOTE — Telephone Encounter (Signed)
I called the pt. Went to the ER on 8/21 after an accident in the barn. Her horse got spooked (did not kick her) and she lost her balance and fell backwards. She hit her tailbone on the ground followed by the back of her head. She stated she was knocked unconscious for quite some time and doesn't recall details but states she was told she was answering questions for EMS and used her phone to call for help. Her next memory was waking up in the CT scanner. Head and cervical CT done. Pt stated there was a very long wait and she consulted Dr Brigitte Pulse and then decided she felt ok to leave after triage. She is resting. She does report having headache, pressure, ringing in the ears, and a woozy/dizzy feeling. She was able to keep her grandson this morning. I suggested she call primary care to be seen. I let her know I would be happy to send a message to Dr Jaynee Eagles however with this new event/issue, she would need a referral if neurology is warranted. She can get into pcp sooner. Pt stated she has a history of concussions as well. She verbalized appreciation for the call.

## 2020-04-06 NOTE — Telephone Encounter (Signed)
See mychart message encounter

## 2020-04-29 ENCOUNTER — Other Ambulatory Visit: Payer: Self-pay | Admitting: Internal Medicine

## 2020-04-29 DIAGNOSIS — Z1231 Encounter for screening mammogram for malignant neoplasm of breast: Secondary | ICD-10-CM

## 2020-05-26 ENCOUNTER — Ambulatory Visit: Payer: 59

## 2020-05-28 ENCOUNTER — Ambulatory Visit
Admission: RE | Admit: 2020-05-28 | Discharge: 2020-05-28 | Disposition: A | Discharge status: Home / Self Care | Payer: 59 | Source: Ambulatory Visit | Attending: Internal Medicine | Admitting: Internal Medicine

## 2020-05-28 ENCOUNTER — Other Ambulatory Visit: Payer: Self-pay

## 2020-05-28 DIAGNOSIS — Z1231 Encounter for screening mammogram for malignant neoplasm of breast: Secondary | ICD-10-CM

## 2020-09-24 ENCOUNTER — Other Ambulatory Visit: Payer: Self-pay

## 2020-09-24 ENCOUNTER — Ambulatory Visit
Admission: RE | Admit: 2020-09-24 | Discharge: 2020-09-24 | Disposition: A | Discharge status: Home / Self Care | Payer: BC Managed Care – PPO | Source: Ambulatory Visit | Attending: Orthopedic Surgery | Admitting: Orthopedic Surgery

## 2020-09-24 ENCOUNTER — Other Ambulatory Visit: Payer: Self-pay | Admitting: Orthopedic Surgery

## 2020-09-24 ENCOUNTER — Encounter (HOSPITAL_BASED_OUTPATIENT_CLINIC_OR_DEPARTMENT_OTHER): Payer: Self-pay | Admitting: Orthopaedic Surgery

## 2020-09-24 DIAGNOSIS — M25512 Pain in left shoulder: Secondary | ICD-10-CM

## 2020-09-25 ENCOUNTER — Other Ambulatory Visit (HOSPITAL_COMMUNITY)
Admission: RE | Admit: 2020-09-25 | Discharge: 2020-09-25 | Disposition: A | Discharge status: Home / Self Care | Payer: BC Managed Care – PPO | Source: Ambulatory Visit | Attending: Orthopaedic Surgery | Admitting: Orthopaedic Surgery

## 2020-09-25 DIAGNOSIS — S42202A Unspecified fracture of upper end of left humerus, initial encounter for closed fracture: Secondary | ICD-10-CM | POA: Diagnosis not present

## 2020-09-25 DIAGNOSIS — Y9352 Activity, horseback riding: Secondary | ICD-10-CM | POA: Diagnosis not present

## 2020-09-25 DIAGNOSIS — Z01812 Encounter for preprocedural laboratory examination: Secondary | ICD-10-CM | POA: Insufficient documentation

## 2020-09-25 DIAGNOSIS — Z882 Allergy status to sulfonamides status: Secondary | ICD-10-CM | POA: Diagnosis not present

## 2020-09-25 DIAGNOSIS — Z87891 Personal history of nicotine dependence: Secondary | ICD-10-CM | POA: Diagnosis not present

## 2020-09-25 DIAGNOSIS — Z20822 Contact with and (suspected) exposure to covid-19: Secondary | ICD-10-CM | POA: Insufficient documentation

## 2020-09-25 DIAGNOSIS — Z79899 Other long term (current) drug therapy: Secondary | ICD-10-CM | POA: Diagnosis not present

## 2020-09-26 LAB — SARS CORONAVIRUS 2 (TAT 6-24 HRS): SARS Coronavirus 2: NEGATIVE

## 2020-09-26 NOTE — H&P (Signed)
PREOPERATIVE H&P  Chief Complaint: LEFT SHOULDER FRACTURE OF UPPER END OF HUMERUS  HPI: [facility] is a 65 y.o. female who is scheduled for Procedure(s): TOTAL SHOULDER ARTHROPLASTY.    Patient had a fall off of her horse on 09/23/2020. She landed on her left shoulder. She had immediate pain. She was seen at [facility] and x-rays showed displaced proximal humerus fracture. She was referred to orthopedics for evaluation and treatment. Her symptoms are rated as moderate to severe, and have been worsening.  This is significantly impairing activities of daily living.    Please see clinic note for further details on this patient's care.    She has elected for surgical management.   Past Medical History:  Diagnosis Date  . Acute ischemic colitis ([place]) 07/11/2012  . Anxiety   . Blood transfusion without reported diagnosis    1997 due to childbirth  . Concussion 10/2017  . Depression   . Diverticulosis   . Fibromyalgia   . Heart murmur   . History of colon polyps    Hyperplastic   . Hx of adenomatous polyp of colon 06/27/2018  . Insomnia   . Shoulder fracture, left, closed, initial encounter   . Situational anxiety   . Wears contact lenses    Past Surgical History:  Procedure Laterality Date  . ABDOMINAL HYSTERECTOMY    . BROW LIFT Bilateral 09/17/2014   Procedure: BILATERAL UPPER BLEPHAROPLASTY;  Surgeon: Charlene Brooke, MD;  Location: [facility];  Service: Plastics;  Laterality: Bilateral;  . CATARACT EXTRACTION Bilateral    Feb 2021 and May 2021  . CESAREAN SECTION     x2  . COLONOSCOPY W/ BIOPSIES  09/20/07  . DILATION AND CURETTAGE OF UTERUS     x3  . HYSTEROTOMY    . TONSILECTOMY, ADENOIDECTOMY, BILATERAL MYRINGOTOMY AND TUBES     pt doesn't think she had tubes placed, maybe as a child?  . TONSILLECTOMY    . TOTAL ABDOMINAL HYSTERECTOMY W/ BILATERAL SALPINGOOPHORECTOMY  2007  . WISDOM TOOTH EXTRACTION     Social History   Socioeconomic  History  . Marital status: Married    Spouse name: Not on file  . Number of children: 2  . Years of education: Not on file  . Highest education level: Bachelor's degree (e.g., BA, AB, BS)  Occupational History  . Not on file  Tobacco Use  . Smoking status: Former Smoker    Packs/day: 0.50    Start date: 30    Quit date: 1990    Years since quitting: 32.1  . Smokeless tobacco: Never Used  . Tobacco comment: hx of smoking for about 2 years in her 63's  Vaping Use  . Vaping Use: Never used  Substance and Sexual Activity  . Alcohol use: Yes    Alcohol/week: 14.0 standard drinks    Types: 14 Glasses of wine per week    Comment: 2 glasses of wine with dinner  . Drug use: No  . Sexual activity: Yes    Birth control/protection: Surgical  Other Topics Concern  . Not on file  Social History Narrative   Lives at home with husband   Right handed   Caffeine: 1 cup/day   Social Determinants of Health   Financial Resource Strain: Not on file  Food Insecurity: Not on file  Transportation Needs: Not on file  Physical Activity: Not on file  Stress: Not on file  Social Connections: Not on file  Family History  Problem Relation Age of Onset  . Heart disease Father   . Multiple sclerosis Mother   . Suicidality Mother   . Breast cancer Sister 22  . Lupus Sister   . Other Sister        hyponatremia, findings on white matter in pons   . Colon cancer Neg Hx   . Colon polyps Neg Hx   . Esophageal cancer Neg Hx   . Rectal cancer Neg Hx   . Stomach cancer Neg Hx    Allergies  Allergen Reactions  . Other     Crab causes nausea  . Sulfa Antibiotics Rash   Prior to Admission medications   Medication Sig Start Date End Date Taking? Authorizing Provider  ALPRAZolam Duanne Moron) 0.5 MG tablet TK 1 T PO  HS PRN 09/23/17  Yes [provider]  citalopram (CELEXA) 20 MG tablet Take 20 mg by mouth daily.   Yes [provider]  Multiple Vitamin (MULTIVITAMIN) tablet Take 1  tablet by mouth daily.   Yes [provider]    ROS: All other systems have been reviewed and were otherwise negative with the exception of those mentioned in the HPI and as above.  Physical Exam: General: Alert, no acute distress Cardiovascular: No pedal edema Respiratory: No cyanosis, no use of accessory musculature GI: No organomegaly, abdomen is soft and non-tender Skin: No lesions in the area of chief complaint Neurologic: Sensation intact distally Psychiatric: Patient is competent for consent with normal mood and affect Lymphatic: No axillary or cervical lymphadenopathy  MUSCULOSKELETAL:  Left shoulder: ROM not tested in setting of known fracture. NVI.  Imaging: CT of left shoulder showing comminuted and displaced proximal humerus fracture.   Assessment: LEFT SHOULDER FRACTURE OF UPPER END OF HUMERUS  Plan: Plan for Procedure(s): TOTAL SHOULDER ARTHROPLASTY  The risks benefits and alternatives were discussed with the patient including but not limited to the risks of nonoperative treatment, versus surgical intervention including infection, bleeding, nerve injury,  blood clots, cardiopulmonary complications, morbidity, mortality, among others, and they were willing to proceed.   We additionally specifically discussed risks of axillary nerve injury, infection, periprosthetic fracture, continued pain and longevity of implants prior to beginning procedure.    Patient will be closely monitored in PACU for medical stabilization and pain control. If found stable in PACU, patient may be discharged home with outpatient follow-up. If any concerns regarding patient's stabilization patient will be admitted for observation after surgery. The patient is planning to be discharged home with outpatient PT.   The patient acknowledged the explanation, agreed to proceed with the plan and consent was signed.   Operative Plan: Left reverse total shoulder arthroplasty for  fracture Discharge Medications: Tylenol, Gabapentin, Oxycodone, Zofran DVT Prophylaxis: Aspirin Physical Therapy: Outpatient PT Special Discharge needs: [place], PA-C  09/26/2020 3:18 PM

## 2020-09-27 ENCOUNTER — Ambulatory Visit (HOSPITAL_COMMUNITY): Payer: BC Managed Care – PPO

## 2020-09-27 ENCOUNTER — Encounter (HOSPITAL_BASED_OUTPATIENT_CLINIC_OR_DEPARTMENT_OTHER)
Admission: RE | Disposition: A | Discharge status: Home / Self Care | Payer: Self-pay | Source: Home / Self Care | Attending: Orthopaedic Surgery

## 2020-09-27 ENCOUNTER — Ambulatory Visit (HOSPITAL_BASED_OUTPATIENT_CLINIC_OR_DEPARTMENT_OTHER)
Admission: RE | Admit: 2020-09-27 | Discharge: 2020-09-27 | Disposition: A | Discharge status: Home / Self Care | Payer: BC Managed Care – PPO | Attending: Orthopaedic Surgery | Admitting: Orthopaedic Surgery

## 2020-09-27 ENCOUNTER — Ambulatory Visit (HOSPITAL_BASED_OUTPATIENT_CLINIC_OR_DEPARTMENT_OTHER): Payer: BC Managed Care – PPO | Admitting: Anesthesiology

## 2020-09-27 ENCOUNTER — Encounter (HOSPITAL_BASED_OUTPATIENT_CLINIC_OR_DEPARTMENT_OTHER): Payer: Self-pay | Admitting: Orthopaedic Surgery

## 2020-09-27 ENCOUNTER — Other Ambulatory Visit: Payer: Self-pay

## 2020-09-27 DIAGNOSIS — Z87891 Personal history of nicotine dependence: Secondary | ICD-10-CM | POA: Insufficient documentation

## 2020-09-27 DIAGNOSIS — Z882 Allergy status to sulfonamides status: Secondary | ICD-10-CM | POA: Insufficient documentation

## 2020-09-27 DIAGNOSIS — S42202A Unspecified fracture of upper end of left humerus, initial encounter for closed fracture: Secondary | ICD-10-CM | POA: Diagnosis not present

## 2020-09-27 DIAGNOSIS — Z79899 Other long term (current) drug therapy: Secondary | ICD-10-CM | POA: Insufficient documentation

## 2020-09-27 DIAGNOSIS — Z09 Encounter for follow-up examination after completed treatment for conditions other than malignant neoplasm: Secondary | ICD-10-CM

## 2020-09-27 DIAGNOSIS — Y9352 Activity, horseback riding: Secondary | ICD-10-CM | POA: Insufficient documentation

## 2020-09-27 DIAGNOSIS — Z20822 Contact with and (suspected) exposure to covid-19: Secondary | ICD-10-CM | POA: Insufficient documentation

## 2020-09-27 HISTORY — DX: Depression, unspecified: F32.A

## 2020-09-27 HISTORY — DX: Fracture of left shoulder girdle, part unspecified, initial encounter for closed fracture: S42.92XA

## 2020-09-27 HISTORY — PX: TOTAL SHOULDER ARTHROPLASTY: SHX126

## 2020-09-27 SURGERY — ARTHROPLASTY, SHOULDER, TOTAL
Anesthesia: General | Site: Shoulder | Laterality: Left

## 2020-09-27 MED ORDER — MELOXICAM 15 MG PO TABS: 15.0000 mg | ORAL_TABLET | Freq: Once | ORAL | Status: DC

## 2020-09-27 MED ORDER — SUGAMMADEX SODIUM 200 MG/2ML IV SOLN
INTRAVENOUS | Status: DC | PRN
  Administered 2020-09-27: 200 mg via INTRAVENOUS

## 2020-09-27 MED ORDER — LIDOCAINE HCL (CARDIAC) PF 100 MG/5ML IV SOSY
PREFILLED_SYRINGE | INTRAVENOUS | Status: DC | PRN
  Administered 2020-09-27: 60 mg via INTRAVENOUS

## 2020-09-27 MED ORDER — FENTANYL CITRATE (PF) 100 MCG/2ML IJ SOLN
INTRAMUSCULAR | Status: AC
  Filled 2020-09-27: qty 2

## 2020-09-27 MED ORDER — VANCOMYCIN HCL 1000 MG IV SOLR
INTRAVENOUS | Status: DC | PRN
  Administered 2020-09-27: 1000 mg via TOPICAL

## 2020-09-27 MED ORDER — ACETAMINOPHEN 500 MG PO TABS
1000.0000 mg | ORAL_TABLET | Freq: Once | ORAL | Status: AC
  Administered 2020-09-27: 1000 mg via ORAL

## 2020-09-27 MED ORDER — FENTANYL CITRATE (PF) 100 MCG/2ML IJ SOLN: 25.0000 ug | INTRAMUSCULAR | Status: DC | PRN

## 2020-09-27 MED ORDER — CEFAZOLIN SODIUM-DEXTROSE 2-4 GM/100ML-% IV SOLN
2.0000 g | INTRAVENOUS | Status: AC
  Administered 2020-09-27: 2 g via INTRAVENOUS

## 2020-09-27 MED ORDER — ACETAMINOPHEN 500 MG PO TABS
ORAL_TABLET | ORAL | Status: AC
  Filled 2020-09-27: qty 2

## 2020-09-27 MED ORDER — ONDANSETRON HCL 4 MG/2ML IJ SOLN
INTRAMUSCULAR | Status: DC | PRN
  Administered 2020-09-27: 4 mg via INTRAVENOUS

## 2020-09-27 MED ORDER — ONDANSETRON HCL 4 MG/2ML IJ SOLN
INTRAMUSCULAR | Status: AC
  Filled 2020-09-27: qty 2

## 2020-09-27 MED ORDER — ASPIRIN 81 MG PO CHEW: 81.0000 mg | CHEWABLE_TABLET | Freq: Two times a day (BID) | ORAL | 0 refills | Status: AC

## 2020-09-27 MED ORDER — MEPERIDINE HCL 25 MG/ML IJ SOLN: 6.2500 mg | INTRAMUSCULAR | Status: DC | PRN

## 2020-09-27 MED ORDER — PHENYLEPHRINE HCL (PRESSORS) 10 MG/ML IV SOLN
INTRAVENOUS | Status: DC | PRN
  Administered 2020-09-27 (×3): 80 ug via INTRAVENOUS

## 2020-09-27 MED ORDER — LIDOCAINE 2% (20 MG/ML) 5 ML SYRINGE
INTRAMUSCULAR | Status: AC
  Filled 2020-09-27: qty 5

## 2020-09-27 MED ORDER — WHITE PETROLATUM EX OINT
TOPICAL_OINTMENT | CUTANEOUS | Status: AC
  Filled 2020-09-27: qty 28.35

## 2020-09-27 MED ORDER — PROMETHAZINE HCL 25 MG/ML IJ SOLN: 6.2500 mg | INTRAMUSCULAR | Status: DC | PRN

## 2020-09-27 MED ORDER — ONDANSETRON HCL 4 MG PO TABS: 4.0000 mg | ORAL_TABLET | Freq: Three times a day (TID) | ORAL | 1 refills | Status: AC | PRN

## 2020-09-27 MED ORDER — CEFAZOLIN SODIUM-DEXTROSE 2-4 GM/100ML-% IV SOLN
INTRAVENOUS | Status: AC
  Filled 2020-09-27: qty 100

## 2020-09-27 MED ORDER — FENTANYL CITRATE (PF) 100 MCG/2ML IJ SOLN
INTRAMUSCULAR | Status: DC | PRN
  Administered 2020-09-27: 50 ug via INTRAVENOUS

## 2020-09-27 MED ORDER — MIDAZOLAM HCL 2 MG/2ML IJ SOLN
1.0000 mg | Freq: Once | INTRAMUSCULAR | Status: AC
  Administered 2020-09-27: 1 mg via INTRAVENOUS

## 2020-09-27 MED ORDER — OXYCODONE HCL 5 MG PO TABS: ORAL_TABLET | ORAL | 0 refills | Status: AC

## 2020-09-27 MED ORDER — BUPIVACAINE LIPOSOME 1.3 % IJ SUSP
INTRAMUSCULAR | Status: DC | PRN
  Administered 2020-09-27: 10 mL via PERINEURAL

## 2020-09-27 MED ORDER — TRANEXAMIC ACID-NACL 1000-0.7 MG/100ML-% IV SOLN
1000.0000 mg | INTRAVENOUS | Status: AC
  Administered 2020-09-27: 1000 mg via INTRAVENOUS

## 2020-09-27 MED ORDER — BUPIVACAINE-EPINEPHRINE (PF) 0.5% -1:200000 IJ SOLN
INTRAMUSCULAR | Status: DC | PRN
  Administered 2020-09-27: 15 mL via PERINEURAL

## 2020-09-27 MED ORDER — LACTATED RINGERS IV SOLN: INTRAVENOUS | Status: DC

## 2020-09-27 MED ORDER — TRANEXAMIC ACID-NACL 1000-0.7 MG/100ML-% IV SOLN
INTRAVENOUS | Status: AC
  Filled 2020-09-27: qty 100

## 2020-09-27 MED ORDER — GABAPENTIN 100 MG PO CAPS: 100.0000 mg | ORAL_CAPSULE | Freq: Two times a day (BID) | ORAL | 0 refills | Status: DC

## 2020-09-27 MED ORDER — FENTANYL CITRATE (PF) 100 MCG/2ML IJ SOLN
50.0000 ug | Freq: Once | INTRAMUSCULAR | Status: AC
  Administered 2020-09-27: 50 ug via INTRAVENOUS

## 2020-09-27 MED ORDER — ROCURONIUM BROMIDE 10 MG/ML (PF) SYRINGE
PREFILLED_SYRINGE | INTRAVENOUS | Status: AC
  Filled 2020-09-27: qty 10

## 2020-09-27 MED ORDER — PROPOFOL 10 MG/ML IV BOLUS
INTRAVENOUS | Status: DC | PRN
  Administered 2020-09-27: 110 mg via INTRAVENOUS

## 2020-09-27 MED ORDER — GABAPENTIN 300 MG PO CAPS
300.0000 mg | ORAL_CAPSULE | Freq: Once | ORAL | Status: AC
  Administered 2020-09-27: 300 mg via ORAL

## 2020-09-27 MED ORDER — PROPOFOL 10 MG/ML IV BOLUS
INTRAVENOUS | Status: AC
  Filled 2020-09-27: qty 20

## 2020-09-27 MED ORDER — ACETAMINOPHEN 500 MG PO TABS: 1000.0000 mg | ORAL_TABLET | Freq: Three times a day (TID) | ORAL | 0 refills | Status: AC

## 2020-09-27 MED ORDER — GABAPENTIN 300 MG PO CAPS
ORAL_CAPSULE | ORAL | Status: AC
  Filled 2020-09-27: qty 1

## 2020-09-27 MED ORDER — DEXAMETHASONE SODIUM PHOSPHATE 4 MG/ML IJ SOLN
INTRAMUSCULAR | Status: DC | PRN
  Administered 2020-09-27: 10 mg via INTRAVENOUS

## 2020-09-27 MED ORDER — MIDAZOLAM HCL 2 MG/2ML IJ SOLN
INTRAMUSCULAR | Status: AC
  Filled 2020-09-27: qty 2

## 2020-09-27 MED ORDER — ROCURONIUM BROMIDE 100 MG/10ML IV SOLN
INTRAVENOUS | Status: DC | PRN
  Administered 2020-09-27: 40 mg via INTRAVENOUS

## 2020-09-27 MED ORDER — SODIUM CHLORIDE 0.9 % IV SOLN
INTRAVENOUS | Status: DC | PRN
  Administered 2020-09-27: 50 ug/min via INTRAVENOUS

## 2020-09-27 SURGICAL SUPPLY — 69 items
AID PSTN UNV HD RSTRNT DISP (MISCELLANEOUS) ×1
APL PRP STRL LF DISP 70% ISPRP (MISCELLANEOUS) ×1
BASEPLATE GLENOSPHERE 25 STD (Miscellaneous) ×2 IMPLANT
BLADE HEX COATED 2.75 (ELECTRODE) IMPLANT
BLADE SAW SAG 73X25 THK (BLADE) ×1
BLADE SAW SGTL 73X25 THK (BLADE) ×1 IMPLANT
BLADE SURG 10 STRL SS (BLADE) IMPLANT
BLADE SURG 15 STRL LF DISP TIS (BLADE) IMPLANT
BLADE SURG 15 STRL SS (BLADE)
BNDG COHESIVE 4X5 TAN STRL (GAUZE/BANDAGES/DRESSINGS) IMPLANT
BSPLAT GLND STD 25 RVRS SHLDR (Miscellaneous) ×1 IMPLANT
CHLORAPREP W/TINT 26 (MISCELLANEOUS) ×2 IMPLANT
CLSR STERI-STRIP ANTIMIC 1/2X4 (GAUZE/BANDAGES/DRESSINGS) ×2 IMPLANT
COOLER ICEMAN CLASSIC (MISCELLANEOUS) IMPLANT
COVER BACK TABLE 60X90IN (DRAPES) ×2 IMPLANT
COVER MAYO STAND STRL (DRAPES) ×2 IMPLANT
COVER WAND RF STERILE (DRAPES) IMPLANT
DECANTER SPIKE VIAL GLASS SM (MISCELLANEOUS) IMPLANT
DRAPE IMP U-DRAPE 54X76 (DRAPES) ×2 IMPLANT
DRAPE INCISE IOBAN 66X45 STRL (DRAPES) ×2 IMPLANT
DRAPE U-SHAPE 76X120 STRL (DRAPES) ×4 IMPLANT
DRSG AQUACEL AG ADV 3.5X 6 (GAUZE/BANDAGES/DRESSINGS) ×2 IMPLANT
ELECT BLADE 4.0 EZ CLEAN MEGAD (MISCELLANEOUS) ×2
ELECT REM PT RETURN 9FT ADLT (ELECTROSURGICAL) ×2
ELECTRODE BLDE 4.0 EZ CLN MEGD (MISCELLANEOUS) ×1 IMPLANT
ELECTRODE REM PT RTRN 9FT ADLT (ELECTROSURGICAL) ×1 IMPLANT
GLENOSPHERE REV SHOULDER 36 (Joint) ×2 IMPLANT
GLOVE ECLIPSE 8.0 STRL XLNG CF (GLOVE) ×2 IMPLANT
GLOVE SRG 8 PF TXTR STRL LF DI (GLOVE) ×1 IMPLANT
GLOVE SURG ENC MOIS LTX SZ6.5 (GLOVE) ×2 IMPLANT
GLOVE SURG UNDER POLY LF SZ6.5 (GLOVE) ×2 IMPLANT
GLOVE SURG UNDER POLY LF SZ8 (GLOVE) ×2
GOWN STRL REUS W/ TWL LRG LVL3 (GOWN DISPOSABLE) ×2 IMPLANT
GOWN STRL REUS W/TWL LRG LVL3 (GOWN DISPOSABLE) ×4
GOWN STRL REUS W/TWL XL LVL3 (GOWN DISPOSABLE) ×2 IMPLANT
GUIDEWIRE GLENOID 2.5X220 (WIRE) ×2 IMPLANT
HANDPIECE INTERPULSE COAX TIP (DISPOSABLE) ×2
IMPL REVERSE SHOULDER 0X3.5 (Shoulder) ×1 IMPLANT
IMPLANT REVERSE SHOULDER 0X3.5 (Shoulder) ×2 IMPLANT
INSERT HUMERAL 36X6MM 12.5DEG (Insert) ×2 IMPLANT
KIT LEG STABILIZATION (KITS) ×2 IMPLANT
KIT STABILIZATION SHOULDER (MISCELLANEOUS) IMPLANT
MANIFOLD NEPTUNE II (INSTRUMENTS) ×2 IMPLANT
NEEDLE MAYO TROCAR (NEEDLE) ×2 IMPLANT
PACK BASIN DAY SURGERY FS (CUSTOM PROCEDURE TRAY) ×2 IMPLANT
PACK SHOULDER (CUSTOM PROCEDURE TRAY) ×2 IMPLANT
PAD COLD SHLDR WRAP-ON (PAD) IMPLANT
PENCIL SMOKE EVACUATOR (MISCELLANEOUS) IMPLANT
RESTRAINT HEAD UNIVERSAL NS (MISCELLANEOUS) ×2 IMPLANT
SCREW 5.0X38 SMALL F/PERFORM (Screw) ×2 IMPLANT
SCREW 5.5X22 (Screw) ×2 IMPLANT
SCREW BONE 6.5X40 SM (Screw) ×2 IMPLANT
SET HNDPC FAN SPRY TIP SCT (DISPOSABLE) ×1 IMPLANT
SHEET MEDIUM DRAPE 40X70 STRL (DRAPES) ×2 IMPLANT
SLEEVE SCD COMPRESS KNEE MED (MISCELLANEOUS) ×2 IMPLANT
SPONGE LAP 18X18 RF (DISPOSABLE) IMPLANT
STEM HUMERAL 3B LONG 98 (Stem) ×1 IMPLANT
STEM HUMERAL SZ 3B LONG 98MM (Stem) ×2 IMPLANT
SUT ETHIBOND 2 V 37 (SUTURE) ×2 IMPLANT
SUT ETHIBOND NAB CT1 #1 30IN (SUTURE) ×2 IMPLANT
SUT FIBERWIRE #5 38 CONV NDL (SUTURE) ×4
SUT MNCRL AB 4-0 PS2 18 (SUTURE) IMPLANT
SUT VIC AB 3-0 SH 27 (SUTURE) ×2
SUT VIC AB 3-0 SH 27X BRD (SUTURE) ×1 IMPLANT
SUTURE FIBERWR #5 38 CONV NDL (SUTURE) ×2 IMPLANT
SYR BULB IRRIG 60ML STRL (SYRINGE) ×2 IMPLANT
TAPE FIBER 2MM 7IN #2 BLUE (SUTURE) ×2 IMPLANT
TOWEL GREEN STERILE FF (TOWEL DISPOSABLE) ×6 IMPLANT
TUBE SUCTION HIGH CAP CLEAR NV (SUCTIONS) ×2 IMPLANT

## 2020-09-27 NOTE — Op Note (Signed)
Orthopaedic Surgery Operative Note (CSN: [id number])  Rebecca Chapman  11/30/1955 Date of Surgery: 09/27/2020   Diagnoses:  Left proximal humeral fracture  Procedure: Left reverse total Shoulder Arthroplasty   Operative Finding Successful completion of planned procedure.  Patient had a comminuted fracture with complete dissociation of the head relative to the shaft and a very short calcar less than 2 mm.  We are very worried about the possibility of avascular necrosis at the patient's age we felt that versus arthroplasty was in her best interest compared to a open reduction internal fixation.  She obtained a second opinion that confirmed this.  We had great fixation and the actual nerve tug test was normal at the beginning and end of the case.  We tied a fiber tape around the calcar to avoid propagation of fractures and had a robust tuberosity repair.   Post-operative plan: The patient will be NWB in sling.  The patient will be will be discharged from PACU if continues to be stable as was plan prior to surgery.  DVT prophylaxis Aspirin 81 mg twice daily for 6 weeks.  Pain control with PRN pain medication preferring oral medicines.  Follow up plan will be scheduled in approximately 7 days for incision check and XR.  Physical therapy to start after first 4 weeks.  Implants: Tornier size 3B long flex stem, 0 high offset tray with a 36+6 poly and a 36 standard glenosphere, 25 mm standard baseplate with a 40 mm center screw  Post-Op Diagnosis: Same Surgeons:Primary: Hiram Gash, MD Assistants:Caroline McBane PA-C Location: [facility] ROOM 6 Anesthesia: General with Exparel Interscalene Antibiotics: Ancef 2g preop, Vancomycin 1000mg  locally Tourniquet time: None Estimated Blood Loss: 599 Complications: None Specimens: None Implants: Implant Name Type Inv. Item Serial No. Manufacturer Lot No. LRB No. Used Action  BASEPLATE GLENOSPHERE 35TS STD - [id number] Miscellaneous BASEPLATE GLENOSPHERE  [id number] STD [id number] [employer]  Left 1 Implanted  SCREW BONE 6.5X40 SM - [id number] Screw SCREW BONE 6.5X40 SM  [employer] STERILIZED ON TRAY Left 1 Implanted  SCREW 5.0X38 SMALL F/PERFORM - [id number] Screw SCREW 5.0X38 SMALL F/PERFORM  [employer] STERILIZED ON TRAY Left 1 Implanted  SCREW 5.5X22 - [id number] Screw SCREW 5.5X22  [employer] STERILIZED ON TRAY Left 1 Implanted  GLENOSPHERE REV SHOULDER 36 - [id number] Joint GLENOSPHERE REV SHOULDER 36 [id number] [employer]  Left 1 Implanted  STEM HUMERAL SZ 3B LONG 98MM - [id number] Stem STEM HUMERAL SZ 3B LONG 98MM [id number] [employer]  Left 1 Implanted  IMPLANT REVERSE SHOULDER 0X3.5 - [id number] Shoulder IMPLANT REVERSE SHOULDER 0X3.5 [id number] [employer]  Left 1 Implanted  INSERT HUMERAL 36X6MM 12.5DEG - [id number] Insert INSERT HUMERAL 36X6MM 12.5DEG [id number] [employer]  Left 1 Implanted    Indications for Surgery:   Rebecca Chapman is a 65 y.o. female with fall off a horse resulting in a complex proximal humerus fracture noted on CT to have a very short calcar.  Benefits and risks of operative and nonoperative management were discussed prior to surgery with patient/guardian(s) and informed consent form was completed.  Infection and need for further surgery were discussed as was prosthetic stability and cuff issues.  We additionally specifically discussed risks of axillary nerve injury, infection, periprosthetic fracture, continued pain and longevity of implants prior to beginning procedure.      Procedure:   The patient was identified in the preoperative holding area where the surgical site was marked. Block placed by anesthesia with exparel.  The patient  was taken to the OR where a procedural timeout was called and the above noted anesthesia was induced.  The patient was positioned beachchair on allen table with spider arm positioner.  Preoperative antibiotics were dosed.  The patient's left shoulder was prepped and draped in  the usual sterile fashion.  A second preoperative timeout was called.       Standard deltopectoral approach was performed with a #10 blade. We dissected down to the subcutaneous tissues and the cephalic vein was taken laterally with the deltoid. Clavipectoral fascia was incised in line with the incision. Deep retractors were placed. The long of the biceps tendon was identified and there was significant tenosynovitis present.  Tenodesis was performed to the pectoralis tendon with #2 Ethibond. The remaining biceps was followed up into the rotator interval where it was released.   We used the bicipital groove as a landmark for the lesser and greater tuberosity fragments.  We were able to mobilize the lesser tuberosity fragment and placed stay sutures in the bone tendon junction to help with mobilization.  This point we were able to identify the  greater tuberosity fragment and 4 #5  FiberWire sutures were used to place into this for eventual repair of the tuberosities.  Once these were both mobilized we took care to identify the shaft fragment as well as the head fragment.  We carefully identified the head fragment were able to manually remove it.  At this point the axillary nerve was found and palpated and with a tug test noted to be intact.  Protected throughout the remainder of the case with blunt retractors.   We then released the SGHL with bovie cautery prior to placing a curved mayo at the junction of the anterior glenoid well above the axillary nerve and bluntly dissecting the subscapularis from the capsule.  We then carefully protected the axillary nerve as we gently released the inferior capsule to fully mobilize the subscapularis.  An anterior deltoid retractor was then placed as well as a small Hohmann retractor superiorly.  The glenoid was relatively preserved as we would expect in this fracture patient.  The remaining labrum was removed circumferentially taking great care not to disrupt the  posterior capsule.   The glenoid drill guide was placed and used to drill a guide pin in the center, inferior position. The glenoid face was then reamed concentrically over the guide wire. The center hole was drilled over the guidepin in a near anatomic angle of version. Next the glenoid vault was drilled back to a depth of 40 mm.  We tapped and then placed a 58mm size baseplate with 0 lateralization was selected with a 6.5 mm x 28mm length central screw.  The base plate was screwed into the glenoid vault obtaining secure fixation. We next placed superior and inferior locking screws for additional fixation.  Next a 36 mm glenosphere was selected and impacted onto the baseplate. The center screw was tightened.  We then repositioned the arm to give access to the humeral shaft fragment.  Drill holes were placed and fiberwire sutures in the shaft for vertical fixation of the tuberosities.    We used broaches to checked for fit and we are able to get an appropriate fit with a long flex stem.  We trialed with multiple size tray and polyethylene options and selected a 0 high which provided good stability and range of motion without excess soft tissue tension. The offset was dialed in to match the normal anatomy. The shoulder  was trialed.  There was good ROM in all planes and the shoulder was stable with no inferior translation.  We then mobilized her tuberosities again and placed the anterior deep limbs of the 5 #5 fiber wires around the stem.  1 of these was tied down fixing the greater tuberosity in place after bone graft harvest from the humeral head component was placed underneath.  A +0 high offset tray was selected and impacted onto the stem.   A 36+6 polyethylene liner was impacted onto the stem.  The joint was reduced and thoroughly irrigated with pulsatile lavage. The remaining sutures were then placed through the subscapularis and the bone tendon junction and the tuberosities were reduced after bone  graft placed beneath as autograft at the subscap.  We horizontally secured the tuberosities before placing vertical fixation with the suture that was placed into the shaft.  Tuberosities moved as a unit were happy with her overall reduction.  This was checked on fluoroscopy confirming our position.  We irrigated copiously at this point.  Hemostasis was obtained. The deltopectoral interval was reapproximated with #1 Ethibond. The subcutaneous tissues were closed with 3-0 Vicryl and the skin was closed with running monocryl.    The wounds were cleaned and dried and an Aquacel dressing was placed. The drapes taken down. The arm was placed into sling with abduction pillow. Patient was awakened, extubated, and transferred to the recovery room in stable condition. There were no intraoperative complications. The sponge, needle, and attention counts were correct at the end of the case.     Noemi Chapel, PA-C, present and scrubbed throughout the case, critical for completion in a timely fashion, and for retraction, instrumentation, closure.

## 2020-09-27 NOTE — Anesthesia Preprocedure Evaluation (Addendum)
Anesthesia Evaluation  Patient identified by MRN, date of birth, ID band Patient awake    Reviewed: Allergy & Precautions, NPO status , Patient's Chart, lab work & pertinent test results  History of Anesthesia Complications Negative for: history of anesthetic complications  Airway Mallampati: II  TM Distance: >3 FB Neck ROM: Full    Dental no notable dental hx.    Pulmonary former smoker,    Pulmonary exam normal        Cardiovascular negative cardio ROS Normal cardiovascular exam     Neuro/Psych Anxiety Depression negative neurological ROS     GI/Hepatic negative GI ROS, Neg liver ROS,   Endo/Other  negative endocrine ROS  Renal/GU negative Renal ROS  negative genitourinary   Musculoskeletal  (+) Fibromyalgia -Left humerus fracture   Abdominal   Peds  Hematology negative hematology ROS (+)   Anesthesia Other Findings Day of surgery medications reviewed with patient.  Reproductive/Obstetrics negative OB ROS                            Anesthesia Physical Anesthesia Plan  ASA: II  Anesthesia Plan: General   Post-op Pain Management: GA combined w/ Regional for post-op pain   Induction: Intravenous  PONV Risk Score and Plan: 3 and Treatment may vary due to age or medical condition, Ondansetron, Dexamethasone and Midazolam  Airway Management Planned: Oral ETT  Additional Equipment: None  Intra-op Plan:   Post-operative Plan: Extubation in OR  Informed Consent: I have reviewed the patients History and Physical, chart, labs and discussed the procedure including the risks, benefits and alternatives for the proposed anesthesia with the patient or authorized representative who has indicated his/her understanding and acceptance.     Dental advisory given  Plan Discussed with: CRNA  Anesthesia Plan Comments:        Anesthesia Quick Evaluation

## 2020-09-27 NOTE — Anesthesia Procedure Notes (Signed)
Anesthesia Regional Block: Interscalene brachial plexus block   Pre-Anesthetic Checklist: ,, timeout performed, Correct Patient, Correct Site, Correct Laterality, Correct Procedure, Correct Position, site marked, Risks and benefits discussed, pre-op evaluation,  At surgeon's request and post-op pain management  Laterality: Left  Prep: Maximum Sterile Barrier Precautions used, chloraprep       Needles:  Injection technique: Single-shot  Needle Type: Echogenic Stimulator Needle     Needle Length: 4cm  Needle Gauge: 22     Additional Needles:   Procedures:,,,, ultrasound used (permanent image in chart),,,,  Narrative:  Start time: 09/27/2020 12:25 PM End time: 09/27/2020 12:28 PM Injection made incrementally with aspirations every 5 mL.  Performed by: Personally  Anesthesiologist: Brennan Bailey, MD  Additional Notes: Risks, benefits, and alternative discussed. Patient gave consent for procedure. Patient prepped and draped in sterile fashion. Sedation administered, patient remains easily responsive to voice. Relevant anatomy identified with ultrasound guidance. Local anesthetic given in 5cc increments with no signs or symptoms of intravascular injection. No pain or paraesthesias with injection. Patient monitored throughout procedure with signs of LAST or immediate complications. Tolerated well. Ultrasound image placed in chart.  Tawny Asal, MD

## 2020-09-27 NOTE — Interval H&P Note (Signed)
History and Physical Interval Note:  09/27/2020 1:18 PM  Rebecca Chapman  has presented today for surgery, with the diagnosis of LEFT SHOULDER FRACTURE OF UPPER END OF HUMERUS.  The various methods of treatment have been discussed with the patient and family. After consideration of risks, benefits and other options for treatment, the patient has consented to  Procedure(s): TOTAL SHOULDER ARTHROPLASTY (Left) as a surgical intervention.  The patient's history has been reviewed, patient examined, no change in status, stable for surgery.  I have reviewed the patient's chart and labs.  Questions were answered to the patient's satisfaction.     Hiram Gash

## 2020-09-27 NOTE — Progress Notes (Signed)
Assisted Dr. Daiva Huge with left, ultrasound guided, interscalene  block. Side rails up, monitors on throughout procedure. See vital signs in flow sheet. Tolerated Procedure well.

## 2020-09-27 NOTE — Transfer of Care (Signed)
Immediate Anesthesia Transfer of Care Note  Patient: Rebecca Chapman  Procedure(s) Performed: TOTAL SHOULDER ARTHROPLASTY (Left Shoulder)  Patient Location: PACU  Anesthesia Type:GA combined with regional for post-op pain  Level of Consciousness: awake, alert  and oriented  Airway & Oxygen Therapy: Patient Spontanous Breathing and Patient connected to face mask oxygen  Post-op Assessment: Report given to RN and Post -op Vital signs reviewed and stable  Post vital signs: Reviewed and stable  Last Vitals:  Vitals Value Taken Time  BP    Temp    Pulse 90 09/27/20 1516  Resp 15 09/27/20 1516  SpO2 99 % 09/27/20 1516  Vitals shown include unvalidated device data.  Last Pain:  Vitals:   09/27/20 1146  TempSrc: Oral  PainSc: 3       Patients Stated Pain Goal: 3 (09/81/19 1478)  Complications: No complications documented.

## 2020-09-27 NOTE — Anesthesia Procedure Notes (Signed)
Procedure Name: Intubation Date/Time: 09/27/2020 1:32 PM Performed by: Maryella Shivers, CRNA Pre-anesthesia Checklist: Patient identified, Emergency Drugs available, Suction available and Patient being monitored Patient Re-evaluated:Patient Re-evaluated prior to induction Oxygen Delivery Method: Circle system utilized Preoxygenation: Pre-oxygenation with 100% oxygen Induction Type: IV induction Ventilation: Mask ventilation without difficulty Laryngoscope Size: Mac and 3 Tube type: Oral Tube size: 7.0 mm Number of attempts: 1 Airway Equipment and Method: Stylet and Oral airway Placement Confirmation: ETT inserted through vocal cords under direct vision,  positive ETCO2 and breath sounds checked- equal and bilateral Secured at: 21 cm Tube secured with: Tape Dental Injury: Teeth and Oropharynx as per pre-operative assessment

## 2020-09-27 NOTE — Discharge Instructions (Signed)
Ophelia Charter MD, MPH Noemi Chapel, PA-C [place] [address], [address] [phone] (tel)   [phone] (fax)   [facility]    WOUND CARE ? You may leave the operative dressing in place until your follow-up appointment. ? KEEP THE INCISIONS CLEAN AND DRY. ? There may be a small amount of fluid/bleeding leaking at the surgical site. This is normal after surgery.  ? If it fills with liquid or blood please call us immediately to change it for you. ? Use the provided ice machine or Ice packs as often as possible for the first 3-4 days, then as needed for pain relief.  Keep a layer of cloth or a shirt between your skin and the cooling unit to prevent frost bite as it can get very cold.  SHOWERING: - You may shower on Post-Op Day #2.  - The dressing is water resistant but do not scrub it as it may start to peel up.   - You may remove the sling for showering, but keep a water resistant pillow under the arm to keep both the  elbow and shoulder away from the body (mimicking the abduction sling).  - Gently pat the area dry.  - Do not soak the shoulder in water. Do not go swimming in the pool or ocean until your sutures are removed. - KEEP THE INCISIONS CLEAN AND DRY.  EXERCISES ? Wear the sling at all times except when doing your exercises.  ? You may remove the sling for showering, but keep the arm across the chest or in a secondary sling.    ? Accidental/Purposeful External Rotation and shoulder flexion (reaching behind you) is to be avoided at all costs for the first month. ? It is ok to come out of your sling if your are sitting and have assistance for eating.   ? Do not lift anything heavier than 1 pound until we discuss it further in clinic.   REGIONAL ANESTHESIA (NERVE BLOCKS) . The anesthesia team may have performed a nerve block for you if safe in the setting of your care.  This is a great tool used to  minimize pain.  Typically the block may start wearing off overnight but the long acting medicine may last for 3-4 days.  The nerve block wearing off can be a challenging period but please utilize your as needed pain medications to try and manage this period.    POST-OP MEDICATIONS- Multimodal approach to pain control . In general your pain will be controlled with a combination of substances.  Prescriptions unless otherwise discussed are electronically sent to your pharmacy.  This is a carefully made plan we use to minimize narcotic use.     ? Acetaminophen - Non-narcotic pain medicine taken on a scheduled basis  ? Gabapentin - this is a non-narcotic medication to help with pain after surgery, take on a scheduled basis ? Oxycodone - This is a strong narcotic, to be used only on an "as needed" basis for pain. ? Aspirin 81mg  - This medicine is used to minimize the risk of blood clots after surgery. ? Zofran -  take as needed for nausea   FOLLOW-UP ? If you develop a Fever (>101.5), Redness or Drainage from the surgical incision site, please call our office to arrange for an evaluation. ? Please call the office to schedule a follow-up appointment for a wound check, 7-10 days post-operatively.  IF YOU HAVE ANY QUESTIONS, PLEASE FEEL FREE  TO CALL OUR OFFICE.  HELPFUL INFORMATION  . If you had a block, it will wear off between 8-24 hrs postop typically.  This is period when your pain may go from nearly zero to the pain you would have had post-op without the block.  This is an abrupt transition but nothing dangerous is happening.  You may take an extra dose of narcotic when this happens.  ? Your arm will be in a sling following surgery. You will be in this sling for the next 3-4 weeks.  I will let you know the exact duration at your follow-up visit.  ? You may be more comfortable sleeping in a semi-seated position the first few nights following surgery.  Keep a pillow propped under the elbow and  forearm for comfort.  If you have a recliner type of chair it might be beneficial.  If not that is fine too, but it would be helpful to sleep propped up with pillows behind your operated shoulder as well under your elbow and forearm.  This will reduce pulling on the suture lines.  ? When dressing, put your operative arm in the sleeve first.  When getting undressed, take your operative arm out last.  Loose fitting, button-down shirts are recommended.  ? In most states it is against the law to drive while your arm is in a sling. And certainly against the law to drive while taking narcotics.  ? You may return to work/school in the next couple of days when you feel up to it. Desk work and typing in the sling is fine.  ? We suggest you use the pain medication the first night prior to going to bed, in order to ease any pain when the anesthesia wears off. You should avoid taking pain medications on an empty stomach as it will make you nauseous.  ? Do not drink alcoholic beverages or take illicit drugs when taking pain medications.  ? Pain medication may make you constipated.  Below are a few solutions to try in this order: - Decrease the amount of pain medication if you aren't having pain. - Drink lots of decaffeinated fluids. - Drink prune juice and/or each dried prunes  o If the first 3 don't work start with additional solutions - Take Colace - an over-the-counter stool softener - Take Senokot - an over-the-counter laxative - Take Miralax - a stronger over-the-counter laxative   Dental Antibiotics:  In most cases prophylactic antibiotics for Dental procdeures after total joint surgery are not necessary.  Exceptions are as follows:  1. History of prior total joint infection  2. Severely immunocompromised (Organ Transplant, cancer chemotherapy, Rheumatoid biologic meds such as [place])  3. Poorly controlled diabetes (A1C &gt; 8.0, blood glucose over 200)  If you have one of these  conditions, contact your surgeon for an antibiotic prescription, prior to your dental procedure.   For more information including helpful videos and documents visit our website:   [email or web address]      Post Anesthesia Home Care Instructions  Activity: Get plenty of rest for the remainder of the day. A responsible individual must stay with you for 24 hours following the procedure.  For the next 24 hours, DO NOT: -Drive a car -Paediatric nurse -Drink alcoholic beverages -Take any medication unless instructed by your physician -Make any legal decisions or sign important papers.  Meals: Start with liquid foods such as gelatin or soup. Progress to regular foods as tolerated. Avoid greasy, spicy, heavy foods. If nausea and/or vomiting  occur, drink only clear liquids until the nausea and/or vomiting subsides. Call your physician if vomiting continues.  Special Instructions/Symptoms: Your throat may feel dry or sore from the anesthesia or the breathing tube placed in your throat during surgery. If this causes discomfort, gargle with warm salt water. The discomfort should disappear within 24 hours.  If you had a scopolamine patch placed behind your ear for the management of post- operative nausea and/or vomiting:  1. The medication in the patch is effective for 72 hours, after which it should be removed.  Wrap patch in a tissue and discard in the trash. Wash hands thoroughly with soap and water. 2. You may remove the patch earlier than 72 hours if you experience unpleasant side effects which may include dry mouth, dizziness or visual disturbances. 3. Avoid touching the patch. Wash your hands with soap and water after contact with the patch.    Regional Anesthesia Blocks  1. Numbness or the inability to move the "blocked" extremity may last from 3-48 hours after placement. The length of time depends on the medication injected and your individual  response to the medication. If the numbness is not going away after 48 hours, call your surgeon.  2. The extremity that is blocked will need to be protected until the numbness is gone and the  Strength has returned. Because you cannot feel it, you will need to take extra care to avoid injury. Because it may be weak, you may have difficulty moving it or using it. You may not know what position it is in without looking at it while the block is in effect.  3. For blocks in the legs and feet, returning to weight bearing and walking needs to be done carefully. You will need to wait until the numbness is entirely gone and the strength has returned. You should be able to move your leg and foot normally before you try and bear weight or walk. You will need someone to be with you when you first try to ensure you do not fall and possibly risk injury.  4. Bruising and tenderness at the needle site are common side effects and will resolve in a few days.  5. Persistent numbness or new problems with movement should be communicated to the surgeon or the [facility] [phone] [place] [phone]).  *May have Tylenol today after 6pm 09/27/20  Information for Discharge Teaching: EXPAREL (bupivacaine liposome injectable suspension)   Your surgeon or anesthesiologist gave you EXPAREL(bupivacaine) to help control your pain after surgery.   EXPAREL is a local anesthetic that provides pain relief by numbing the tissue around the surgical site.  EXPAREL is designed to release pain medication over time and can control pain for up to 72 hours.  Depending on how you respond to EXPAREL, you may require less pain medication during your recovery.  Possible side effects:  Temporary loss of sensation or ability to move in the area where bupivacaine was injected.  Nausea, vomiting, constipation  Rarely, numbness and tingling in your mouth or lips, lightheadedness, or anxiety may  occur.  Call your doctor right away if you think you may be experiencing any of these sensations, or if you have other questions regarding possible side effects.  Follow all other discharge instructions given to you by your surgeon or nurse. Eat a healthy diet and drink plenty of water or other fluids.  If you return to the hospital for any reason within 96 hours following the  administration of EXPAREL, it is important for health care providers to know that you have received this anesthetic. A teal colored band has been placed on your arm with the date, time and amount of EXPAREL you have received in order to alert and inform your health care providers. Please leave this armband in place for the full 96 hours following administration, and then you may remove the band.

## 2020-09-28 ENCOUNTER — Encounter (HOSPITAL_BASED_OUTPATIENT_CLINIC_OR_DEPARTMENT_OTHER): Payer: Self-pay | Admitting: Orthopaedic Surgery

## 2020-09-28 NOTE — Anesthesia Postprocedure Evaluation (Signed)
Anesthesia Post Note  Patient: Rebecca Chapman  Procedure(s) Performed: TOTAL SHOULDER ARTHROPLASTY (Left Shoulder)     Patient location during evaluation: PACU Anesthesia Type: General Level of consciousness: awake and alert and oriented Pain management: pain level controlled Vital Signs Assessment: post-procedure vital signs reviewed and stable Respiratory status: spontaneous breathing, nonlabored ventilation and respiratory function stable Cardiovascular status: blood pressure returned to baseline Postop Assessment: no apparent nausea or vomiting Anesthetic complications: no   No complications documented.  Last Vitals:  Vitals:   09/27/20 1546 09/27/20 1601  BP:  (!) 164/84  Pulse: 80 84  Resp: 14 20  Temp:  37 C  SpO2: 99% 99%    Last Pain:  Vitals:   09/27/20 1601  TempSrc: Oral  PainSc: 0-No pain                 Brennan Bailey

## 2021-04-21 ENCOUNTER — Other Ambulatory Visit: Payer: Self-pay | Admitting: Internal Medicine

## 2021-04-21 DIAGNOSIS — Z1231 Encounter for screening mammogram for malignant neoplasm of breast: Secondary | ICD-10-CM

## 2021-05-30 ENCOUNTER — Ambulatory Visit
Admission: RE | Admit: 2021-05-30 | Discharge: 2021-05-30 | Disposition: A | Discharge status: Home / Self Care | Payer: BC Managed Care – PPO | Source: Ambulatory Visit | Attending: Internal Medicine | Admitting: Internal Medicine

## 2021-05-30 DIAGNOSIS — Z1231 Encounter for screening mammogram for malignant neoplasm of breast: Secondary | ICD-10-CM

## 2021-06-20 ENCOUNTER — Other Ambulatory Visit: Payer: Self-pay | Admitting: Internal Medicine

## 2021-06-20 DIAGNOSIS — Z8249 Family history of ischemic heart disease and other diseases of the circulatory system: Secondary | ICD-10-CM

## 2021-07-18 ENCOUNTER — Ambulatory Visit
Admission: RE | Admit: 2021-07-18 | Discharge: 2021-07-18 | Disposition: A | Discharge status: Home / Self Care | Payer: Self-pay | Source: Ambulatory Visit | Attending: Internal Medicine | Admitting: Internal Medicine

## 2021-07-18 DIAGNOSIS — Z8249 Family history of ischemic heart disease and other diseases of the circulatory system: Secondary | ICD-10-CM

## 2022-01-30 ENCOUNTER — Other Ambulatory Visit: Payer: Self-pay

## 2022-01-30 ENCOUNTER — Encounter (HOSPITAL_BASED_OUTPATIENT_CLINIC_OR_DEPARTMENT_OTHER): Payer: Self-pay

## 2022-01-30 ENCOUNTER — Other Ambulatory Visit (HOSPITAL_BASED_OUTPATIENT_CLINIC_OR_DEPARTMENT_OTHER): Payer: Self-pay

## 2022-01-30 ENCOUNTER — Emergency Department (HOSPITAL_BASED_OUTPATIENT_CLINIC_OR_DEPARTMENT_OTHER)
Admission: EM | Admit: 2022-01-30 | Discharge: 2022-01-30 | Disposition: A | Discharge status: Home / Self Care | Payer: Medicare PPO | Attending: Emergency Medicine | Admitting: Emergency Medicine

## 2022-01-30 DIAGNOSIS — X58XXXA Exposure to other specified factors, initial encounter: Secondary | ICD-10-CM | POA: Insufficient documentation

## 2022-01-30 DIAGNOSIS — S20102A Unspecified superficial injuries of breast, left breast, initial encounter: Secondary | ICD-10-CM | POA: Diagnosis present

## 2022-01-30 DIAGNOSIS — Z9012 Acquired absence of left breast and nipple: Secondary | ICD-10-CM | POA: Diagnosis not present

## 2022-01-30 DIAGNOSIS — N644 Mastodynia: Secondary | ICD-10-CM

## 2022-01-30 MED ORDER — MORPHINE SULFATE (PF) 4 MG/ML IV SOLN
6.0000 mg | Freq: Once | INTRAVENOUS | Status: AC
  Administered 2022-01-30: 6 mg via INTRAVENOUS
  Filled 2022-01-30: qty 2

## 2022-01-30 MED ORDER — MORPHINE SULFATE (PF) 4 MG/ML IV SOLN
4.0000 mg | Freq: Once | INTRAVENOUS | Status: AC
  Administered 2022-01-30: 4 mg via INTRAVENOUS
  Filled 2022-01-30: qty 1

## 2022-01-30 MED ORDER — ONDANSETRON HCL 4 MG/2ML IJ SOLN
4.0000 mg | Freq: Once | INTRAMUSCULAR | Status: AC
Start: 2022-01-30 — End: 2022-01-30
  Administered 2022-01-30: 4 mg via INTRAVENOUS
  Filled 2022-01-30: qty 2

## 2022-01-30 MED ORDER — IBUPROFEN 800 MG PO TABS
800.0000 mg | ORAL_TABLET | Freq: Three times a day (TID) | ORAL | 0 refills | Status: DC
  Filled 2022-01-30: qty 21, 7d supply, fill #0

## 2022-01-30 MED ORDER — ACETAMINOPHEN 325 MG PO TABS
650.0000 mg | ORAL_TABLET | Freq: Four times a day (QID) | ORAL | 0 refills | Status: DC | PRN
  Filled 2022-01-30: qty 100, 13d supply, fill #0

## 2022-01-30 MED ORDER — OXYCODONE HCL 5 MG PO TABS
5.0000 mg | ORAL_TABLET | Freq: Four times a day (QID) | ORAL | 0 refills | Status: AC | PRN
  Filled 2022-01-30: qty 12, 3d supply, fill #0

## 2022-01-30 MED ORDER — CEFAZOLIN SODIUM-DEXTROSE 2-4 GM/100ML-% IV SOLN
2.0000 g | Freq: Once | INTRAVENOUS | Status: AC
  Administered 2022-01-30: 2 g via INTRAVENOUS
  Filled 2022-01-30: qty 100

## 2022-01-30 NOTE — ED Triage Notes (Signed)
Patient here POV from Home.  Endorses being Bit by Own horse approximately 45 minutes PTA. Horse bit Left Nipple Off. Bleeding Controlled at this Time.   NAD Noted during Triage. A&Ox4. GCS 15. Ambulatory.

## 2022-01-30 NOTE — Discharge Instructions (Signed)
Please keep area clean, dry, and covered with gauze.  Do not soak in baths, pools, lakes, or hot tubs for the next 2 months.  Follow-up with plastic surgeon provided for follow-up visit.

## 2022-01-30 NOTE — ED Notes (Signed)
ED Provider at bedside. 

## 2022-01-30 NOTE — ED Provider Notes (Addendum)
[facility] EMERGENCY DEPT Provider Note   CSN: [id number] Arrival date & time: 01/30/22  1322     History  Chief Complaint  Patient presents with   Breast Injury    Rebecca Chapman is a 66 y.o. female.  Patient is a 66 year old female presenting for breast injury.  Patient states 45 minutes prior to arrival her horse bit off her left nipple.  Presented with nipple.  The history is provided by the patient. No language interpreter was used.       Home Medications Prior to Admission medications   Medication Sig Start Date End Date Taking? Authorizing Provider  acetaminophen (TYLENOL) 325 MG tablet Take 2 tablets (650 mg total) by mouth every 6 (six) hours as needed. 9/50/93  Yes Campbell Stall P, DO  ibuprofen (ADVIL) 800 MG tablet Take 1 tablet (800 mg total) by mouth 3 (three) times daily. 2/67/12  Yes Campbell Stall P, DO  oxyCODONE (ROXICODONE) 5 MG immediate release tablet Take 1 tablet (5 mg total) by mouth every 6 (six) hours as needed for up to 3 days for severe pain. 01/30/22 4/58/09 Yes Campbell Stall P, DO  ALPRAZolam Duanne Moron) 0.5 MG tablet TK 1 T PO  HS PRN 09/23/17   [provider]  citalopram (CELEXA) 20 MG tablet Take 20 mg by mouth daily.    [provider]  gabapentin (NEURONTIN) 100 MG capsule Take 1 capsule (100 mg total) by mouth 2 (two) times daily for 14 days. For pain. 09/27/20 10/11/20  Ethelda Chick, PA-C  Multiple Vitamin (MULTIVITAMIN) tablet Take 1 tablet by mouth daily.    [provider]      Allergies    Other and Sulfa antibiotics    Review of Systems   Review of Systems  Constitutional:  Negative for chills and fever.  HENT:  Negative for ear pain and sore throat.   Eyes:  Negative for pain and visual disturbance.  Respiratory:  Negative for cough and shortness of breath.   Cardiovascular:  Negative for chest pain and palpitations.  Gastrointestinal:  Negative for abdominal pain and vomiting.   Genitourinary:  Negative for dysuria and hematuria.  Musculoskeletal:  Negative for arthralgias and back pain.  Skin:  Positive for wound. Negative for color change and rash.  Neurological:  Negative for seizures and syncope.  All other systems reviewed and are negative.   Physical Exam Updated Vital Signs BP (!) 148/81 (BP Location: Right Arm)   Pulse (!) 54   Temp 98.9 F (37.2 C) (Oral)   Resp 14   Ht '5\' 6"'$  (1.676 m)   Wt 52.6 kg   SpO2 99%   BMI 18.72 kg/m  Physical Exam Vitals and nursing note reviewed.  Constitutional:      General: She is not in acute distress.    Appearance: She is well-developed.  HENT:     Head: Normocephalic and atraumatic.  Eyes:     Conjunctiva/sclera: Conjunctivae normal.  Cardiovascular:     Rate and Rhythm: Normal rate and regular rhythm.     Heart sounds: No murmur heard. Pulmonary:     Effort: Pulmonary effort is normal. No respiratory distress.     Breath sounds: Normal breath sounds.  Chest:    Abdominal:     Palpations: Abdomen is soft.     Tenderness: There is no abdominal tenderness.  Musculoskeletal:        General: No swelling.     Cervical back: Neck supple.  Skin:  General: Skin is warm and dry.     Capillary Refill: Capillary refill takes less than 2 seconds.  Neurological:     Mental Status: She is alert.  Psychiatric:        Mood and Affect: Mood normal.     ED Results / Procedures / Treatments   Labs (all labs ordered are listed, but only abnormal results are displayed) Labs Reviewed - No data to display  EKG None  Radiology No results found.  Procedures .Critical Care  Performed by: Lianne Cure, DO Authorized by: Lianne Cure, DO   Critical care provider statement:    Critical care time (minutes):  30   Critical care was necessary to treat or prevent imminent or life-threatening deterioration of the following conditions: Nipple amputation.   Critical care was time spent personally by me  on the following activities:  Development of treatment plan with patient or surrogate, discussions with consultants, evaluation of patient's response to treatment, examination of patient, ordering and review of laboratory studies, ordering and review of radiographic studies, ordering and performing treatments and interventions, pulse oximetry, re-evaluation of patient's condition and review of old charts   Care discussed with comment:  Personally called 3 different plastic surgeons for recommendations     Medications Ordered in ED Medications  morphine (PF) 4 MG/ML injection 6 mg (6 mg Intravenous Given 01/30/22 1344)  ondansetron (ZOFRAN) injection 4 mg (4 mg Intravenous Given 01/30/22 1345)  ceFAZolin (ANCEF) IVPB 2g/100 mL premix (0 g Intravenous Stopped 01/30/22 1424)  morphine (PF) 4 MG/ML injection 4 mg (4 mg Intravenous Given 01/30/22 1500)    ED Course/ Medical Decision Making/ A&P                           Medical Decision Making Risk OTC drugs. Prescription drug management.   55:87 PM 66 year old female presenting for breast injury.  Is a 66 year old female presenting after her horse put off her left nipple excellently 45 minutes prior to arrival.  Bleeding controlled at this time.  Please see attached photos for further details.  Remaining areola intact.  No other wounds or damage to body.  IV inserted.  Morphine and Zofran given for symptomatic management.  Ancef given.  Consult to general surgery recommends consult to plastic surgery.    3:45 PM Call sent to plastic surgery.  I spoke with [facility] team who states unfortunately they are not on-call for this week.  States the nipple needs to be reattached immediately.  Recommends transfer to hospital with on-call plastics.  I spoke with second plastic surgeon from [facility] who recommends no reattachment.  Recommends wound care and keeping the area clean and letting it heal by secondary intention. I called  [facility] and spoke with on call plastic surgeon Dr. Marc Morgans who recommends no reattachment.  Recommends letting skin heal by secondary intention.  Wound cleaned with normal saline and iodine.  Compression sterile gauze placed.  Patient given sterile gauze, nonstick gauze, tape, and bacitracin for home use with prompt follow-up instructions with plastic surgery in the next 2 weeks.  Education sent for pain control.  Patient in no distress and overall condition improved here in the ED. Detailed discussions were had with the patient regarding current findings, and need for close f/u with PCP or on call doctor. The patient has been instructed to return immediately if the symptoms worsen in any way for re-evaluation. Patient verbalized understanding  and is in agreement with current care plan. All questions answered prior to discharge.         Final Clinical Impression(s) / ED Diagnoses Final diagnoses:  Nipple amputation     Rx / DC Orders ED Discharge Orders          Ordered    oxyCODONE (ROXICODONE) 5 MG immediate release tablet  Every 6 hours PRN        01/30/22 1541    acetaminophen (TYLENOL) 325 MG tablet  Every 6 hours PRN        01/30/22 1541    ibuprofen (ADVIL) 800 MG tablet  3 times daily        01/30/22 1541              Lianne Cure, DO 03/04/56 5051    Lianne Cure, DO 83/35/82 1545

## 2022-01-30 NOTE — ED Notes (Signed)
Patient verbalizes understanding of discharge instructions. Opportunity for questioning and answers were provided. Patient discharged from ED.  °

## 2022-02-01 ENCOUNTER — Telehealth: Payer: Self-pay | Admitting: Plastic Surgery

## 2022-02-07 ENCOUNTER — Ambulatory Visit: Payer: Medicare PPO | Admitting: Internal Medicine

## 2022-02-07 ENCOUNTER — Encounter: Payer: Self-pay | Admitting: Internal Medicine

## 2022-02-07 VITALS — BP 120/82 | HR 62 | Ht 66.0 in | Wt 120.2 lb

## 2022-02-07 DIAGNOSIS — R079 Chest pain, unspecified: Secondary | ICD-10-CM

## 2022-02-07 DIAGNOSIS — Z8249 Family history of ischemic heart disease and other diseases of the circulatory system: Secondary | ICD-10-CM

## 2022-02-07 DIAGNOSIS — R931 Abnormal findings on diagnostic imaging of heart and coronary circulation: Secondary | ICD-10-CM

## 2022-02-07 DIAGNOSIS — E785 Hyperlipidemia, unspecified: Secondary | ICD-10-CM

## 2022-02-07 DIAGNOSIS — Z0000 Encounter for general adult medical examination without abnormal findings: Secondary | ICD-10-CM | POA: Diagnosis not present

## 2022-02-08 LAB — NMR, LIPOPROFILE
Cholesterol, Total: 208 mg/dL — ABNORMAL HIGH (ref 100–199)
HDL Particle Number: 48 umol/L (ref >=30.5)
HDL-C: 96 mg/dL (ref >39)
LDL Particle Number: 1004 nmol/L — ABNORMAL HIGH (ref <1000)
LDL Size: 21.2 nm (ref >20.5)
LDL-C (NIH Calc): 100 mg/dL — ABNORMAL HIGH (ref 0–99)
LP-IR Score: <25 (ref <=45)
Small LDL Particle Number: <90 nmol/L (ref <=527)
Triglycerides: 69 mg/dL (ref 0–149)

## 2022-02-08 LAB — LIPOPROTEIN A (LPA): Lipoprotein (a): 122.4 nmol/L — ABNORMAL HIGH (ref <75.0)

## 2022-02-08 LAB — C-REACTIVE PROTEIN: CRP: 2 mg/L (ref 0–10)

## 2022-02-08 LAB — TSH: TSH: 1.33 u[IU]/mL (ref 0.450–4.500)

## 2022-02-08 LAB — APOLIPOPROTEIN B: Apolipoprotein B: 76 mg/dL (ref <90)

## 2022-02-09 ENCOUNTER — Telehealth: Payer: Self-pay

## 2022-02-09 DIAGNOSIS — E785 Hyperlipidemia, unspecified: Secondary | ICD-10-CM

## 2022-02-09 DIAGNOSIS — Z79899 Other long term (current) drug therapy: Secondary | ICD-10-CM

## 2022-02-09 MED ORDER — ATORVASTATIN CALCIUM 10 MG PO TABS: 10.0000 mg | ORAL_TABLET | Freq: Every day | ORAL | 3 refills | Status: DC

## 2022-02-09 NOTE — Telephone Encounter (Signed)
My Chart message sent to the pt waiting for her to send me a date for the lab.

## 2022-02-09 NOTE — Telephone Encounter (Signed)
-----   Message from Fay Records, MD sent at 02/09/2022 10:34 AM EDT ----- Spoke to patient  Thyroid function normal Lipids   LDL 100 with 1004 particles  Lpa 122    Reviewed. She did not tolerate Crestor  Achy   Make sure on list Willing to try Lipitor   Metabolized differently    Recomm:  10 mg     Check lipomed and CK and liver panel in 8 wks   Call in to her pharmacy

## 2022-02-17 ENCOUNTER — Ambulatory Visit: Payer: Medicare PPO | Admitting: Plastic Surgery

## 2022-02-17 ENCOUNTER — Encounter: Payer: Self-pay | Admitting: Plastic Surgery

## 2022-02-17 DIAGNOSIS — Z9012 Acquired absence of left breast and nipple: Secondary | ICD-10-CM | POA: Diagnosis not present

## 2022-02-17 DIAGNOSIS — S21052A Open bite of left breast, initial encounter: Secondary | ICD-10-CM | POA: Diagnosis not present

## 2022-02-17 DIAGNOSIS — W5511XA Bitten by horse, initial encounter: Secondary | ICD-10-CM

## 2022-02-17 DIAGNOSIS — S21009A Unspecified open wound of unspecified breast, initial encounter: Secondary | ICD-10-CM | POA: Insufficient documentation

## 2022-02-17 DIAGNOSIS — S21002A Unspecified open wound of left breast, initial encounter: Secondary | ICD-10-CM

## 2022-02-17 NOTE — Progress Notes (Signed)
Patient ID: Rebecca Chapman, female    DOB: May 12, 1956, 66 y.o.   MRN: [id number]   Chief Complaint  Patient presents with   Consult   Breast Problem    The patient is a 66 year old female here for evaluation of her nipples.  The patient was seen in the emergency room on June 19 after she had an an encounter with her horse who bit through her chart and bit off her left nipple.  She presented with the nipple to the emergency room.  It was not sutured on.  She has been keeping it clean.  The area is healing well and no sign of infection.     Review of Systems  Constitutional: Negative.   HENT: Negative.    Eyes: Negative.   Respiratory: Negative.    Cardiovascular: Negative.   Gastrointestinal: Negative.   Endocrine: Negative.   Genitourinary: Negative.   Musculoskeletal: Negative.   Skin:  Positive for wound.    Past Medical History:  Diagnosis Date   Acute ischemic colitis ([place]) 07/11/2012   Anxiety    Blood transfusion without reported diagnosis    1997 due to childbirth   Concussion 10/2017   Depression    Diverticulosis    Fibromyalgia    Heart murmur    History of colon polyps    Hyperplastic    Hx of adenomatous polyp of colon 06/27/2018   Insomnia    Shoulder fracture, left, closed, initial encounter    Situational anxiety    Wears contact lenses     Past Surgical History:  Procedure Laterality Date   ABDOMINAL HYSTERECTOMY     BROW LIFT Bilateral 09/17/2014   Procedure: BILATERAL UPPER BLEPHAROPLASTY;  Surgeon: Charlene Brooke, MD;  Location: [facility];  Service: Plastics;  Laterality: Bilateral;   CATARACT EXTRACTION Bilateral    Feb 2021 and May 2021   CESAREAN SECTION     x2   COLONOSCOPY W/ BIOPSIES  09/20/07   DILATION AND CURETTAGE OF UTERUS     x3   HYSTEROTOMY     TONSILECTOMY, ADENOIDECTOMY, BILATERAL MYRINGOTOMY AND TUBES     pt doesn't think she had tubes placed, maybe as a child?   TONSILLECTOMY     TOTAL ABDOMINAL  HYSTERECTOMY W/ BILATERAL SALPINGOOPHORECTOMY  2007   TOTAL SHOULDER ARTHROPLASTY Left 09/27/2020   Procedure: TOTAL SHOULDER ARTHROPLASTY;  Surgeon: Hiram Gash, MD;  Location: [facility];  Service: Orthopedics;  Laterality: Left;   WISDOM TOOTH EXTRACTION        Current Outpatient Medications:    acetaminophen (TYLENOL) 325 MG tablet, Take 2 tablets (650 mg total) by mouth every 6 (six) hours as needed. (Patient taking differently: Take 800 mg by mouth as needed.), Disp: 100 tablet, Rfl: 0   atorvastatin (LIPITOR) 10 MG tablet, Take 1 tablet (10 mg total) by mouth daily., Disp: 90 tablet, Rfl: 3   citalopram (CELEXA) 20 MG tablet, Take 20 mg by mouth daily., Disp: , Rfl:    Multiple Vitamin (MULTIVITAMIN) tablet, Take 1 tablet by mouth daily., Disp: , Rfl:    mupirocin ointment (BACTROBAN) 2 %, Apply topically daily., Disp: , Rfl:    traZODone (DESYREL) 50 MG tablet, Take 50 mg by mouth at bedtime as needed., Disp: , Rfl:    tretinoin (RETIN-A) 0.05 % cream, Apply 1 Application topically 3 (three) times a week., Disp: , Rfl:    valACYclovir (VALTREX) 1000 MG tablet, Take 4,000 mg by mouth  as needed., Disp: , Rfl:    Objective:   Vitals:   02/17/22 1339  BP: (!) 147/77  Pulse: 64  SpO2: 99%    Physical Exam Vitals and nursing note reviewed.  Constitutional:      Appearance: Normal appearance.  HENT:     Head: Normocephalic and atraumatic.  Cardiovascular:     Rate and Rhythm: Normal rate.     Pulses: Normal pulses.  Pulmonary:     Effort: Pulmonary effort is normal.  Abdominal:     General: There is no distension.     Palpations: Abdomen is soft.     Tenderness: There is no abdominal tenderness.  Musculoskeletal:        General: Swelling and deformity present.  Skin:    General: Skin is warm.     Capillary Refill: Capillary refill takes less than 2 seconds.     Coloration: Skin is not jaundiced.     Findings: Bruising present.  Neurological:      Mental Status: She is alert and oriented to person, place, and time.  Psychiatric:        Mood and Affect: Mood normal.        Behavior: Behavior normal.        Thought Content: Thought content normal.        Judgment: Judgment normal.     Assessment & Plan:  Horse bite, initial encounter  Wound of left breast, initial encounter  Recommend scar cream twice a day for three months.  Follow up as needed. She has options for reconstruction if needed. Pictures were obtained of the patient and placed in the chart with the patient's or guardian's permission.   [place], DO

## 2022-02-21 ENCOUNTER — Telehealth: Payer: Self-pay

## 2022-02-21 NOTE — Telephone Encounter (Signed)
duplicate

## 2022-02-21 NOTE — Telephone Encounter (Signed)
Pt to have fasting repeat labs 04/11/22.

## 2022-02-21 NOTE — Telephone Encounter (Signed)
-----   Message from Fay Records, MD sent at 02/09/2022 10:34 AM EDT ----- Spoke to patient  Thyroid function normal Lipids   LDL 100 with 1004 particles  Lpa 122    Reviewed. She did not tolerate Crestor  Achy   Make sure on list Willing to try Lipitor   Metabolized differently    Recomm:  10 mg     Check lipomed and CK and liver panel in 8 wks   Call in to her pharmacy

## 2022-03-27 ENCOUNTER — Telehealth: Payer: Self-pay | Admitting: Internal Medicine

## 2022-03-27 DIAGNOSIS — E785 Hyperlipidemia, unspecified: Secondary | ICD-10-CM

## 2022-03-27 DIAGNOSIS — Z79899 Other long term (current) drug therapy: Secondary | ICD-10-CM

## 2022-03-27 NOTE — Telephone Encounter (Signed)
Pt c/o medication issue:  1. Name of Medication:  atorvastatin (LIPITOR) 10 MG tablet   2. How are you currently taking this medication (dosage and times per day)? As prescribed  3. Are you having a reaction (difficulty breathing--STAT)?  Muscular issues  4. What is your medication issue?   Patient stated she tried this for 30-35 days and has been off of the medication for about 3 weeks and she would like to know her next steps.

## 2022-03-29 NOTE — Telephone Encounter (Signed)
Will route this message to our PharmD team for further assistance.  Will place referral to lipid clinic accordingly thereafter, if coverage provided for Repatha.

## 2022-03-29 NOTE — Telephone Encounter (Signed)
Referral placed to lipid clinic to discuss lipid management options.  [facility] to call the pt and arrange lipid clinic appt.  Staff message will be sent to [facility] to arrange.       Natasha Mead - 03/27/2022 10:21 AM Supple, Harlon Flor, RPH-CPP  Sent: Wed March 29, 2022  1:30 PM  To: Nuala Alpha, LPN          Message  ----- Message from Leeroy Bock, RPH-CPP sent at 03/29/2022  1:30 PM EDT -----  Yes you're good to replace referral to lipid clinic, we'll discuss med options and coverage for Repatha when we see her at her visit

## 2022-03-29 NOTE — Telephone Encounter (Signed)
Contacted patient    She says she developed muscle achiness on Lipitor   STopped    Reports that she is feeling better     Calcium score is 65   Needs control   Lpa elevated 122 REcomm Repatha     Please clarify coverage and then refer to lipid clinic to review medicine with patient  Follow up lipomed, Lpa and liver panel in 8 wks

## 2022-04-11 ENCOUNTER — Other Ambulatory Visit: Payer: Medicare PPO

## 2022-05-01 ENCOUNTER — Ambulatory Visit: Payer: Medicare PPO | Attending: Interventional Cardiology | Admitting: Pharmacist

## 2022-05-01 ENCOUNTER — Telehealth: Payer: Self-pay | Admitting: Pharmacist

## 2022-05-01 DIAGNOSIS — E785 Hyperlipidemia, unspecified: Secondary | ICD-10-CM | POA: Diagnosis not present

## 2022-05-01 NOTE — Patient Instructions (Addendum)
I will submit a prior authorization for Repatha. I will let you know when I hear back  Please call me at [phone] with any questions

## 2022-05-01 NOTE — Telephone Encounter (Signed)
PA approved through 08/13/22. Pt make aware. Rx sent. Labs scheduled for 12/5.

## 2022-05-01 NOTE — Telephone Encounter (Signed)
PA for Repatha submitted Key: [id number]

## 2022-05-01 NOTE — Progress Notes (Signed)
Patient ID: [name]                 DOB: [birth year]                    MRN: [id number]      HPI: Rebecca Chapman is a 66 y.o. female patient referred to lipid clinic by Dr. Harrington Chapman. PMH is significant for CAD on CT, chest pain, family hx of CAD. CT calcium score done in Dec 2022.  Calcium score is 305 (93%ile) with 287 in LAD; 18 in RCA . Patient has tried rosuvastatin and atorvastatin, both resulting in muscle pains.  Patient presents today to lipid clinic. She reports that both rosuvastatin and atorvastatin resulted in muscle aches around the 30 day mark. Pain resolved within a few days of stopping. She has tried rosuvastatin '10mg'$  daily, '5mg'$  daily and '5mg'$  every other day. No drug holiday between doses. Most recently was on atorvastatin '10mg'$  daily. She is very active. Runs on treadmill. Wants to start going to silver sneakers and weight training. Has a horse and is active in taking care of it. Son is in school. 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 She is vegan, although sometimes eats fish or eggs.  Current Medications: none Intolerances: rosuvastatin ('10mg'$ , '5mg'$  and '5mg'$  every other day), atorvastatin '10mg'$  daily (muscle aches) Risk Factors: elevated LPa, CAD on CT LDL goal: <70 (ideally closer to 55) ApoB goal: <80 (ideally <70)  Diet: almost vegan, some fish, beans, tofu, protein shakes, some eggs,   Exercise: run  Family History: The patient's family history includes Breast cancer (age of onset: 66) in her sister; Heart disease in her father; Lupus in her sister; Multiple sclerosis in her mother; Other in her sister; Suicidality in her mother.   Social History:  The patient  reports that she quit smoking about 33 years ago. Her smoking use included cigarettes. She started smoking about 38 years ago. She smoked an average of .5 packs per day. She has never used smokeless tobacco. She reports current alcohol use of about 14.0 standard drinks of alcohol per week. She reports that  she does not use drugs.   Labs:02/07/22 LPa 122, ApoB 76, LDL-P 1004, LDL-C 100, TG 69, HDL 96 LDL size 21.2  Past Medical History:  Diagnosis Date   Acute ischemic colitis ([place]) 07/11/2012   Anxiety    Blood transfusion without reported diagnosis    1997 due to childbirth   Concussion 10/2017   Depression    Diverticulosis    Fibromyalgia    Heart murmur    History of colon polyps    Hyperplastic    Hx of adenomatous polyp of colon 06/27/2018   Insomnia    Shoulder fracture, left, closed, initial encounter    Situational anxiety    Wears contact lenses     Current Outpatient Medications on File Prior to Visit  Medication Sig Dispense Refill   acetaminophen (TYLENOL) 325 MG tablet Take 2 tablets (650 mg total) by mouth every 6 (six) hours as needed. (Patient taking differently: Take 800 mg by mouth as needed.) 100 tablet 0   atorvastatin (LIPITOR) 10 MG tablet Take 1 tablet (10 mg total) by mouth daily. 90 tablet 3   citalopram (CELEXA) 20 MG tablet Take 20 mg by mouth daily.     Multiple Vitamin (MULTIVITAMIN) tablet Take 1 tablet by mouth daily.     mupirocin ointment (BACTROBAN) 2 % Apply topically daily.  traZODone (DESYREL) 50 MG tablet Take 50 mg by mouth at bedtime as needed.     tretinoin (RETIN-A) 0.05 % cream Apply 1 Application topically 3 (three) times a week.     valACYclovir (VALTREX) 1000 MG tablet Take 4,000 mg by mouth as needed.     No current facility-administered medications on file prior to visit.    Allergies  Allergen Reactions   Avelox [Moxifloxacin]    Other     Crab causes nausea   Sulfa Antibiotics Rash    Assessment/Plan:  1. Hyperlipidemia - LDL-C is above goal of <70. Ideally ApoB would be <60. We had a long discussion about her risk, CAC score results, family hx, Lpa what it is and what it tells [place] and reviewed MNR results. Discussed medication options, including trial of pravastatin, rosuvastatin '5mg'$  three times a week, Nexletol or  Repatha. We discussed the evidence behind each, the benefits and side effects and cost. After some thought, patient decided she would like to try Repatha. Injection technique reviewed. I will submit prior authorization to insurance. Recheck lipids in 2-3 months. I encouraged patient to add in strength training to her exercise.   Thank you,   Ramond Dial, Pharm.D, BCPS, CPP [facility]  [address] [place], [place] [address]  Phone: [phone]; Fax: [phone]

## 2022-05-02 MED ORDER — REPATHA SURECLICK 140 MG/ML ~~LOC~~ SOAJ: 1.0000 | SUBCUTANEOUS | 11 refills | Status: DC

## 2022-05-19 ENCOUNTER — Other Ambulatory Visit: Payer: Self-pay | Admitting: Internal Medicine

## 2022-05-19 DIAGNOSIS — Z139 Encounter for screening, unspecified: Secondary | ICD-10-CM

## 2022-06-16 ENCOUNTER — Ambulatory Visit
Admission: RE | Admit: 2022-06-16 | Discharge: 2022-06-16 | Disposition: A | Discharge status: Home / Self Care | Payer: Medicare PPO | Source: Ambulatory Visit | Attending: Internal Medicine | Admitting: Internal Medicine

## 2022-06-16 DIAGNOSIS — Z1231 Encounter for screening mammogram for malignant neoplasm of breast: Secondary | ICD-10-CM | POA: Diagnosis not present

## 2022-06-16 DIAGNOSIS — Z139 Encounter for screening, unspecified: Secondary | ICD-10-CM

## 2022-06-19 DIAGNOSIS — E559 Vitamin D deficiency, unspecified: Secondary | ICD-10-CM | POA: Diagnosis not present

## 2022-06-19 DIAGNOSIS — R7989 Other specified abnormal findings of blood chemistry: Secondary | ICD-10-CM | POA: Diagnosis not present

## 2022-06-19 DIAGNOSIS — Z0000 Encounter for general adult medical examination without abnormal findings: Secondary | ICD-10-CM | POA: Diagnosis not present

## 2022-06-26 DIAGNOSIS — M8580 Other specified disorders of bone density and structure, unspecified site: Secondary | ICD-10-CM | POA: Diagnosis not present

## 2022-06-26 DIAGNOSIS — N952 Postmenopausal atrophic vaginitis: Secondary | ICD-10-CM | POA: Diagnosis not present

## 2022-06-26 DIAGNOSIS — I2510 Atherosclerotic heart disease of native coronary artery without angina pectoris: Secondary | ICD-10-CM | POA: Diagnosis not present

## 2022-06-26 DIAGNOSIS — R82998 Other abnormal findings in urine: Secondary | ICD-10-CM | POA: Diagnosis not present

## 2022-06-26 DIAGNOSIS — Z23 Encounter for immunization: Secondary | ICD-10-CM | POA: Diagnosis not present

## 2022-06-26 DIAGNOSIS — Z0000 Encounter for general adult medical examination without abnormal findings: Secondary | ICD-10-CM | POA: Diagnosis not present

## 2022-06-26 DIAGNOSIS — M797 Fibromyalgia: Secondary | ICD-10-CM | POA: Diagnosis not present

## 2022-06-26 DIAGNOSIS — R930 Abnormal findings on diagnostic imaging of skull and head, not elsewhere classified: Secondary | ICD-10-CM | POA: Diagnosis not present

## 2022-06-26 DIAGNOSIS — J45909 Unspecified asthma, uncomplicated: Secondary | ICD-10-CM | POA: Diagnosis not present

## 2022-06-26 DIAGNOSIS — M1990 Unspecified osteoarthritis, unspecified site: Secondary | ICD-10-CM | POA: Diagnosis not present

## 2022-07-18 ENCOUNTER — Ambulatory Visit: Payer: Medicare PPO | Attending: Internal Medicine

## 2022-07-18 DIAGNOSIS — E785 Hyperlipidemia, unspecified: Secondary | ICD-10-CM | POA: Diagnosis not present

## 2022-07-25 LAB — LIPID PANEL
Chol/HDL Ratio: 1.4 ratio (ref 0.0–4.4)
Cholesterol, Total: 160 mg/dL (ref 100–199)
HDL: 115 mg/dL (ref >39)
LDL Chol Calc (NIH): 35 mg/dL (ref 0–99)
Triglycerides: 43 mg/dL (ref 0–149)
VLDL Cholesterol Cal: 10 mg/dL (ref 5–40)

## 2022-07-25 LAB — NMR, LIPOPROFILE
Cholesterol, Total: 168 mg/dL (ref 100–199)
HDL Particle Number: 54.4 umol/L (ref >=30.5)
HDL-C: 115 mg/dL (ref >39)
LDL Particle Number: 310 nmol/L (ref <1000)
LDL-C (NIH Calc): 43 mg/dL (ref 0–99)
LP-IR Score: <25 (ref <=45)
Small LDL Particle Number: <90 nmol/L (ref <=527)
Triglycerides: 47 mg/dL (ref 0–149)

## 2022-07-25 LAB — LIPOPROTEIN A (LPA): Lipoprotein (a): 86.4 nmol/L — ABNORMAL HIGH (ref <75.0)

## 2022-07-25 LAB — APOLIPOPROTEIN B: Apolipoprotein B: 40 mg/dL (ref <90)

## 2022-08-24 ENCOUNTER — Other Ambulatory Visit (HOSPITAL_COMMUNITY): Payer: Self-pay

## 2022-10-31 DIAGNOSIS — D225 Melanocytic nevi of trunk: Secondary | ICD-10-CM | POA: Diagnosis not present

## 2022-10-31 DIAGNOSIS — L821 Other seborrheic keratosis: Secondary | ICD-10-CM | POA: Diagnosis not present

## 2022-10-31 DIAGNOSIS — D692 Other nonthrombocytopenic purpura: Secondary | ICD-10-CM | POA: Diagnosis not present

## 2022-10-31 DIAGNOSIS — L814 Other melanin hyperpigmentation: Secondary | ICD-10-CM | POA: Diagnosis not present

## 2022-10-31 DIAGNOSIS — D2271 Melanocytic nevi of right lower limb, including hip: Secondary | ICD-10-CM | POA: Diagnosis not present

## 2022-10-31 DIAGNOSIS — D2262 Melanocytic nevi of left upper limb, including shoulder: Secondary | ICD-10-CM | POA: Diagnosis not present

## 2022-10-31 DIAGNOSIS — L570 Actinic keratosis: Secondary | ICD-10-CM | POA: Diagnosis not present

## 2022-10-31 DIAGNOSIS — D2261 Melanocytic nevi of right upper limb, including shoulder: Secondary | ICD-10-CM | POA: Diagnosis not present

## 2023-01-10 ENCOUNTER — Encounter: Payer: Medicare PPO | Admitting: Physical Therapy

## 2023-01-10 DIAGNOSIS — D3132 Benign neoplasm of left choroid: Secondary | ICD-10-CM | POA: Diagnosis not present

## 2023-01-10 DIAGNOSIS — H43813 Vitreous degeneration, bilateral: Secondary | ICD-10-CM | POA: Diagnosis not present

## 2023-01-10 DIAGNOSIS — H04123 Dry eye syndrome of bilateral lacrimal glands: Secondary | ICD-10-CM | POA: Diagnosis not present

## 2023-01-10 DIAGNOSIS — H26493 Other secondary cataract, bilateral: Secondary | ICD-10-CM | POA: Diagnosis not present

## 2023-01-15 ENCOUNTER — Ambulatory Visit: Payer: Medicare PPO | Attending: Internal Medicine | Admitting: Physical Therapy

## 2023-01-15 ENCOUNTER — Encounter: Payer: Self-pay | Admitting: Physical Therapy

## 2023-01-15 VITALS — BP 131/87 | HR 61

## 2023-01-15 DIAGNOSIS — R2681 Unsteadiness on feet: Secondary | ICD-10-CM | POA: Diagnosis not present

## 2023-01-15 DIAGNOSIS — R42 Dizziness and giddiness: Secondary | ICD-10-CM | POA: Insufficient documentation

## 2023-01-15 NOTE — Therapy (Signed)
OUTPATIENT PHYSICAL THERAPY VESTIBULAR EVALUATION     Patient Name: Rebecca Chapman MRN: [id number] DOB:02-Feb-1956, 67 y.o., female Today's Date: 01/15/2023  END OF SESSION:  PT End of Session - 01/15/23 1353     Visit Number 1    Number of Visits 5    Date for PT Re-Evaluation 02/14/23    Authorization Type [employer]    PT Start Time 1305    PT Stop Time 1347    PT Time Calculation (min) 42 min    Activity Tolerance Patient tolerated treatment well    Behavior During Therapy [facility] for tasks assessed/performed             Past Medical History:  Diagnosis Date   Acute ischemic colitis (H[facility]) 07/11/2012   Anxiety    Blood transfusion without reported diagnosis    1997 due to childbirth   Concussion 10/2017   Depression    Diverticulosis    Fibromyalgia    Heart murmur    History of colon polyps    Hyperplastic    Hx of adenomatous polyp of colon 06/27/2018   Insomnia    Shoulder fracture, left, closed, initial encounter    Situational anxiety    Wears contact lenses    Past Surgical History:  Procedure Laterality Date   ABDOMINAL HYSTERECTOMY     BROW LIFT Bilateral 09/17/2014   Procedure: BILATERAL UPPER BLEPHAROPLASTY;  Surgeon: Karie Fetch, MD;  Location: [facility];  Service: Plastics;  Laterality: Bilateral;   CATARACT EXTRACTION Bilateral    Feb 2021 and May 2021   CESAREAN SECTION     x2   COLONOSCOPY W/ BIOPSIES  09/20/07   DILATION AND CURETTAGE OF UTERUS     x3   HYSTEROTOMY     TONSILECTOMY, ADENOIDECTOMY, BILATERAL MYRINGOTOMY AND TUBES     pt doesn't think she had tubes placed, maybe as a child?   TONSILLECTOMY     TOTAL ABDOMINAL HYSTERECTOMY W/ BILATERAL SALPINGOOPHORECTOMY  2007   TOTAL SHOULDER ARTHROPLASTY Left 09/27/2020   Procedure: TOTAL SHOULDER ARTHROPLASTY;  Surgeon: Bjorn Pippin, MD;  Location: [facility];  Service: Orthopedics;  Laterality: Left;   WISDOM TOOTH EXTRACTION     Patient  Active Problem List   Diagnosis Date Noted   Hyperlipidemia 05/01/2022   Horse bite 02/17/2022   Breast wound 02/17/2022   Hx of adenomatous polyp of colon 06/27/2018   IBS (irritable bowel syndrome) 07/29/2012    PCP: Creola Corn, MD  REFERRING PROVIDER: Creola Corn, MD  REFERRING DIAG: R42 (ICD-10-CM) - Dizziness and giddiness   THERAPY DIAG:  Dizziness and giddiness  Unsteadiness on feet  ONSET DATE: 01/01/2023  Rationale for Evaluation and Treatment: Rehabilitation  SUBJECTIVE:   SUBJECTIVE STATEMENT: Feels like she has pressure in her head. Went to her eye doctor and everything was good. Has had a few episodes of vertigo this year. Some days were ok and other days were unable to walk. Reports vertigo is still kinda there - but not as bad. Room doesn't spin and doesn't feel nauseous. Feels very off balance and will veer. Feels swimmy headed. Bending over to clean her horse's hooves, moving head quickly or when she gets out of bed in the morning makes it worse. Reports riding a horse and driving is completely fine.   Pt accompanied by: self  PERTINENT HISTORY: PMH: Anxiety, Fibromyalgia, Concussion 10/2017 and 2021   PAIN:  Are you having pain? No  Vitals:  01/15/23 1319 01/15/23 1331  BP: 138/80 131/87  Pulse: (!) 56 61   Sitting, Standing   PRECAUTIONS: None  WEIGHT BEARING RESTRICTIONS: No  FALLS: Has patient fallen in last 6 months? No  PLOF: Leisure: Rides horses everyday   PATIENT GOALS: wants to have tools in tool belt, that when this happens, wants to know what to do.   OBJECTIVE:    COGNITION: Overall cognitive status: Within functional limits for tasks assessed    GAIT: Gait pattern: WFL Distance walked: Clinic distances  Assistive device utilized: None Level of assistance: Complete Independence Comments: Pt does report veering during gait on worse days.    VESTIBULAR ASSESSMENT:  GENERAL OBSERVATION: Ambulates into clinic with no AD  independently.    SYMPTOM BEHAVIOR:  Subjective history: See above.   Non-Vestibular symptoms:  N/A  Type of dizziness: Imbalance (Disequilibrium), "Funny feeling in the head", and "Swimmyheaded"  Frequency: Daily  Duration: If it hits during the morning, is off and on during the day. This past episode has been going on and off.   Aggravating factors: Induced by position change: supine to sit and Induced by motion: bending down to the ground, turning body quickly, and turning head quickly  Relieving factors: lying supine and being still, putting pressure on her head.   Progression of symptoms: better  OCULOMOTOR EXAM:  Ocular Alignment: normal  Ocular ROM: No Limitations  Spontaneous Nystagmus: absent  Gaze-Induced Nystagmus: absent  Smooth Pursuits: intact  Saccades: intact  VESTIBULAR - OCULAR REFLEX:   Slow VOR: Normal  VOR Cancellation: Normal  Head-Impulse Test: HIT Right: negative HIT Left: negative  Dynamic Visual Acuity: Static: Line 10 Dynamic: Line 8 2 line difference, mild dizziness   POSITIONAL TESTING: Right Dix-Hallpike: no nystagmus Left Dix-Hallpike: no nystagmus Right Roll Test: no nystagmus Left Roll Test: no nystagmus Right Sidelying: no nystagmus Left Sidelying: no nystagmus  Pt just with dizziness when coming upright, more to to the L side.   MOTION SENSITIVITY:  Motion Sensitivity Quotient Intensity: 0 = none, 1 = Lightheaded, 2 = Mild, 3 = Moderate, 4 = Severe, 5 = Vomiting  Intensity  1. Sitting to supine   2. Supine to L side   3. Supine to R side   4. Supine to sitting 1-2  5. L Hallpike-Dix 0  6. Up from L  1-2  7. R Hallpike-Dix 0  8. Up from R  1-2  9. Sitting, head tipped to L knee 2  10. Head up from L knee 2  11. Sitting, head tipped to R knee 2  12. Head up from R knee 2  13. Standing head turns x5 2, feels like going to the L  14.Standing head nods x5 0  15. In stance, 180 turn to L  0  16. In stance, 180 turn to R 0    Pt  reports with bending, just a little different in how her head feels.  Performed bending and head motions in standing      M-CTSIB  Condition 1: Firm Surface, EO 30 Sec, Normal Sway  Condition 2: Firm Surface, EC 30 Sec, Normal Sway  Condition 3: Foam Surface, EO 30 Sec, Normal Sway  Condition 4: Foam Surface, EC 30 Sec, Mild Sway       VESTIBULAR TREATMENT:  DATE: 01/15/23  Habituation:  Brandt-Daroff: number of reps: 3 reps bilat, pt more symptomatic when coming up from the L. Did improve with incr reps. Provided as HEP. Pt with no dizziness when in sidelying position, just when coming upright.    PATIENT EDUCATION: Education details: Clinical findings, POC, initial Francee Piccolo Daroff exercises to HEP  Person educated: Patient Education method: Explanation, Demonstration, and Handouts Education comprehension: verbalized understanding and returned demonstration  HOME EXERCISE PROGRAM: Access Code: [email or web address] URL: [email or web address] Date: 01/15/2023 Prepared by: Sherlie Ban  Exercises - Brandt-Daroff Vestibular Exercise  - 2 x daily - 7 x weekly - 1 sets - 10 reps  GOALS: Goals reviewed with patient? Yes  SHORT TERM GOALS: ALL STGS= LTGS  LONG TERM GOALS: Target date: 02/12/2023  Pt will be independent with final HEP for vestibular deficits in order to build upon functional gains made in therapy. Baseline:  Goal status: INITIAL  2.  SOT to be assessed with goal written.  Baseline:  Goal status: INITIAL  3.  Pt will perform items on MSQ rating a 0/5 in order to demo improved motion sensitivity.  Baseline: 1-2/5 Goal status: INITIAL  4.  Pt will perform DVA with 1 line difference or less with no dizziness in order to demo improved VOR.  Baseline: 2 line difference and mild dizziness.  Goal status: INITIAL    ASSESSMENT:  CLINICAL IMPRESSION: Patient is  a 67 year old female referred to Neuro OPPT for dizziness.   Pt's PMH is significant for: Anxiety, Fibromyalgia, Concussion 10/2017 and 2021 . The following deficits were present during the exam: 2 line difference and mild dizziness with DVA, mild postural sway with condition 4 on mCTSIB, motion sensitivities with bending over, head motions, and coming up from [employer] positions. Unsure of pt's cause of dizziness, but pt does appear to have some motion sensitivities and a mild hypofunction. Pt's orthostatics were negative from sitting > standing. Will further assess balance at next session. Pt would benefit from skilled PT to address these impairments and functional limitations to maximize functional mobility independence and decr dizziness.    OBJECTIVE IMPAIRMENTS: decreased activity tolerance, decreased balance, and dizziness.   ACTIVITY LIMITATIONS: bending, transfers, locomotion level, and sit > stand, moving quickly   PARTICIPATION LIMITATIONS:  doing bending tasks to take care of her horse   PERSONAL FACTORS: Age, Past/current experiences, Time since onset of injury/illness/exacerbation, and 1-2 comorbidities: Anxiety, Fibromyalgia, Concussion 10/2017 and 2021   are also affecting patient's functional outcome.   REHAB POTENTIAL: Good  CLINICAL DECISION MAKING: Stable/uncomplicated  EVALUATION COMPLEXITY: Low   PLAN:  PT FREQUENCY: 1-2x/week  PT DURATION: 4 weeks  PLANNED INTERVENTIONS: Therapeutic exercises, Therapeutic activity, Neuromuscular re-education, Balance training, Gait training, Patient/Family education, Self Care, Joint mobilization, Vestibular training, Canalith repositioning, and Re-evaluation  PLAN FOR NEXT SESSION: Perform SOT and write goal as needed. Add to HEP for bending, VOR, balance with EC. How were brandt daroff exercises?    Drake Leach, PT, DPT  01/15/2023, 1:55 PM

## 2023-01-19 ENCOUNTER — Ambulatory Visit: Payer: Medicare PPO | Admitting: Physical Therapy

## 2023-01-19 ENCOUNTER — Encounter: Payer: Self-pay | Admitting: Physical Therapy

## 2023-01-19 DIAGNOSIS — R2681 Unsteadiness on feet: Secondary | ICD-10-CM | POA: Diagnosis not present

## 2023-01-19 DIAGNOSIS — R42 Dizziness and giddiness: Secondary | ICD-10-CM | POA: Diagnosis not present

## 2023-01-19 NOTE — Therapy (Signed)
OUTPATIENT PHYSICAL THERAPY VESTIBULAR TREATMENT     Patient Name: Rebecca Chapman MRN: [id number] DOB:30-Oct-1955, 67 y.o., female Today's Date: 01/19/2023  END OF SESSION:  PT End of Session - 01/19/23 1021     Visit Number 2    Number of Visits 5    Date for PT Re-Evaluation 02/14/23    Authorization Type [employer] Medicare - Will Need Auth    PT Start Time 1019    PT Stop Time 1100    PT Time Calculation (min) 41 min    Activity Tolerance Patient tolerated treatment well    Behavior During Therapy WFL for tasks assessed/performed             Past Medical History:  Diagnosis Date   Acute ischemic colitis (HCC) 07/11/2012   Anxiety    Blood transfusion without reported diagnosis    1997 due to childbirth   Concussion 10/2017   Depression    Diverticulosis    Fibromyalgia    Heart murmur    History of colon polyps    Hyperplastic    Hx of adenomatous polyp of colon 06/27/2018   Insomnia    Shoulder fracture, left, closed, initial encounter    Situational anxiety    Wears contact lenses    Past Surgical History:  Procedure Laterality Date   ABDOMINAL HYSTERECTOMY     BROW LIFT Bilateral 09/17/2014   Procedure: BILATERAL UPPER BLEPHAROPLASTY;  Surgeon: Karie Fetch, MD;  Location: [facility];  Service: Plastics;  Laterality: Bilateral;   CATARACT EXTRACTION Bilateral    Feb 2021 and May 2021   CESAREAN SECTION     x2   COLONOSCOPY W/ BIOPSIES  09/20/07   DILATION AND CURETTAGE OF UTERUS     x3   HYSTEROTOMY     TONSILECTOMY, ADENOIDECTOMY, BILATERAL MYRINGOTOMY AND TUBES     pt doesn't think she had tubes placed, maybe as a child?   TONSILLECTOMY     TOTAL ABDOMINAL HYSTERECTOMY W/ BILATERAL SALPINGOOPHORECTOMY  2007   TOTAL SHOULDER ARTHROPLASTY Left 09/27/2020   Procedure: TOTAL SHOULDER ARTHROPLASTY;  Surgeon: Bjorn Pippin, MD;  Location: [facility];  Service: Orthopedics;  Laterality: Left;   WISDOM TOOTH EXTRACTION      Patient Active Problem List   Diagnosis Date Noted   Hyperlipidemia 05/01/2022   Horse bite 02/17/2022   Breast wound 02/17/2022   Hx of adenomatous polyp of colon 06/27/2018   IBS (irritable bowel syndrome) 07/29/2012    PCP: Creola Corn, MD  REFERRING PROVIDER: Creola Corn, MD  REFERRING DIAG: R42 (ICD-10-CM) - Dizziness and giddiness   THERAPY DIAG:  Dizziness and giddiness  Unsteadiness on feet  ONSET DATE: 01/01/2023  Rationale for Evaluation and Treatment: Rehabilitation  SUBJECTIVE:   SUBJECTIVE STATEMENT: Feels clear headed. Dizziness is better, but not completely gone. Has been trying the [employer] exercises.   Pt accompanied by: self  PERTINENT HISTORY: PMH: Anxiety, Fibromyalgia, Concussion 10/2017 and 2021   PAIN:  Are you having pain? No  There were no vitals filed for this visit.  PRECAUTIONS: None  WEIGHT BEARING RESTRICTIONS: No  FALLS: Has patient fallen in last 6 months? No  PLOF: Leisure: Rides horses everyday   PATIENT GOALS: wants to have tools in tool belt, that when this happens, wants to know what to do.   OBJECTIVE:    COGNITION: Overall cognitive status: Within functional limits for tasks assessed    GAIT: Gait pattern: [facility] Distance walked: Clinic  distances  Assistive device utilized: None Level of assistance: Complete Independence Comments: Pt does report veering during gait on worse days.    VESTIBULAR TREATMENT:                                                                                                   DATE: 01/19/23  Conditions: 1: 3 trials WNL 2: 3 trials WNL 3:  3 trials WNL 4: 1 trial below normal 2 trials above  5: 1 trial below normal 2 trials above  6: 3 falls  Composite score: 58 (below normal) Sensory Analysis Som: WNL Vis: WNL Vest: Below normal  Pref: Below normal  Strategy analysis: Equal hip/ankle strategy COG alignment: Pt with weight towards the L side    NMR:  Gaze  Adaptation: x1 Viewing Horizontal: Position: Standing, Time: 30, Reps: 2, and Comment: pt initially having trouble keeping eyes focused  x1 Viewing Vertical:  Position: Standing, Time: 30, Reps: 2, and Comment: pt initially having trouble keeping eyes focused    Access Code: [email or web address] URL: [email or web address] Date: 01/19/2023 Prepared by: Sherlie Ban  Exercises - Brandt-Daroff Vestibular Exercise  - 2 x daily - 7 x weekly - 1 sets - 10 reps  Bolded below added on 01/19/23 for improved vestibular input:  - Romberg Stance Eyes Closed on Foam Pad  - 2 x daily - 5 x weekly - 2 sets - 10 reps - performed with 10 head turns and 10 head nods  - Standing March  - 2 x daily - 5 x weekly - 2 sets - 10 reps - EC on foam    PATIENT EDUCATION: Education details: results of SOT and purpose of vestibular system, additions to HEP for VOR and vestibular system for balance   Person educated: Patient Education method: Explanation, Demonstration, and Handouts Education comprehension: verbalized understanding and returned demonstration  HOME EXERCISE PROGRAM: Standing VOR x30 seconds   Access Code: [email or web address] URL: [email or web address] Date: 01/19/2023 Prepared by: Sherlie Ban  Exercises - Brandt-Daroff Vestibular Exercise  - 2 x daily - 7 x weekly - 1 sets - 10 reps - Romberg Stance Eyes Closed on Foam Pad  - 2 x daily - 5 x weekly - 2 sets - 10 reps - Standing March  - 2 x daily - 5 x weekly - 2 sets - 10 reps  GOALS: Goals reviewed with patient? Yes  SHORT TERM GOALS: ALL STGS= LTGS  LONG TERM GOALS: Target date: 02/12/2023  Pt will be independent with final HEP for vestibular deficits in order to build upon functional gains made in therapy. Baseline:  Goal status: INITIAL  2.  Pt will improve composite score and vestibular sensory analysis to WNL  Baseline: both below normal  Goal status: INITIAL  3.  Pt will perform items on MSQ rating a 0/5 in order  to demo improved motion sensitivity.  Baseline: 1-2/5 Goal status: INITIAL  4.  Pt will perform DVA with 1 line difference or less with no dizziness in order to demo improved VOR.  Baseline: 2 line difference and mild dizziness.  Goal status: INITIAL  ASSESSMENT:  CLINICAL IMPRESSION: Performed the SOT with pt scoring below normal limits for composite score and vestibular sensory analysis for balance (see above for further details). LTG updated and educated pt of results. Remainder of session focused on initiating HEP for balance with vestibular input and VOR exercises. Pt already reports dizziness has been better since eval. Will continue per POC.    OBJECTIVE IMPAIRMENTS: decreased activity tolerance, decreased balance, and dizziness.   ACTIVITY LIMITATIONS: bending, transfers, locomotion level, and sit > stand, moving quickly   PARTICIPATION LIMITATIONS:  doing bending tasks to take care of her horse   PERSONAL FACTORS: Age, Past/current experiences, Time since onset of injury/illness/exacerbation, and 1-2 comorbidities: Anxiety, Fibromyalgia, Concussion 10/2017 and 2021   are also affecting patient's functional outcome.   REHAB POTENTIAL: Good  CLINICAL DECISION MAKING: Stable/uncomplicated  EVALUATION COMPLEXITY: Low   PLAN:  PT FREQUENCY: 1-2x/week  PT DURATION: 4 weeks  PLANNED INTERVENTIONS: Therapeutic exercises, Therapeutic activity, Neuromuscular re-education, Balance training, Gait training, Patient/Family education, Self Care, Joint mobilization, Vestibular training, Canalith repositioning, and Re-evaluation  PLAN FOR NEXT SESSION: VOR, balance with EC, work on bending tasks    Drake Leach, PT, DPT  01/19/2023, 11:03 AM

## 2023-01-19 NOTE — Patient Instructions (Signed)
Gaze Stabilization: Standing Feet Apart    Feet shoulder width apart, keeping eyes on target on wall __a few__ feet away, tilt head down 15-30 and move head side to side for __30__ seconds. Repeat while moving head up and down for _30___ seconds.  Perform 2-3 sets of each Do _2___ sessions per day.  Copyright  VHI. All rights reserved.   

## 2023-01-24 ENCOUNTER — Ambulatory Visit: Payer: Medicare PPO | Admitting: Physical Therapy

## 2023-01-24 ENCOUNTER — Encounter: Payer: Self-pay | Admitting: Physical Therapy

## 2023-01-24 DIAGNOSIS — R2681 Unsteadiness on feet: Secondary | ICD-10-CM | POA: Diagnosis not present

## 2023-01-24 DIAGNOSIS — R42 Dizziness and giddiness: Secondary | ICD-10-CM

## 2023-01-24 NOTE — Therapy (Signed)
OUTPATIENT PHYSICAL THERAPY VESTIBULAR TREATMENT     Patient Name: Rebecca Chapman MRN: [id number] DOB:02-Mar-1956, 67 y.o., female Today's Date: 01/24/2023  END OF SESSION:  PT End of Session - 01/24/23 1022     Visit Number 3    Number of Visits 5    Date for PT Re-Evaluation 02/14/23    Authorization Type [employer] - Approved 5 visits 01/19/24 - 02/07/23    PT Start Time 1022   pt late to session   PT Stop Time 1100    PT Time Calculation (min) 38 min    Activity Tolerance Patient tolerated treatment well    Behavior During Therapy [facility] for tasks assessed/performed             Past Medical History:  Diagnosis Date   Acute ischemic colitis (HCC) 07/11/2012   Anxiety    Blood transfusion without reported diagnosis    1997 due to childbirth   Concussion 10/2017   Depression    Diverticulosis    Fibromyalgia    Heart murmur    History of colon polyps    Hyperplastic    Hx of adenomatous polyp of colon 06/27/2018   Insomnia    Shoulder fracture, left, closed, initial encounter    Situational anxiety    Wears contact lenses    Past Surgical History:  Procedure Laterality Date   ABDOMINAL HYSTERECTOMY     BROW LIFT Bilateral 09/17/2014   Procedure: BILATERAL UPPER BLEPHAROPLASTY;  Surgeon: Karie Fetch, MD;  Location: [facility];  Service: Plastics;  Laterality: Bilateral;   CATARACT EXTRACTION Bilateral    Feb 2021 and May 2021   CESAREAN SECTION     x2   COLONOSCOPY W/ BIOPSIES  09/20/07   DILATION AND CURETTAGE OF UTERUS     x3   HYSTEROTOMY     TONSILECTOMY, ADENOIDECTOMY, BILATERAL MYRINGOTOMY AND TUBES     pt doesn't think she had tubes placed, maybe as a child?   TONSILLECTOMY     TOTAL ABDOMINAL HYSTERECTOMY W/ BILATERAL SALPINGOOPHORECTOMY  2007   TOTAL SHOULDER ARTHROPLASTY Left 09/27/2020   Procedure: TOTAL SHOULDER ARTHROPLASTY;  Surgeon: Bjorn Pippin, MD;  Location: [facility];  Service: Orthopedics;   Laterality: Left;   WISDOM TOOTH EXTRACTION     Patient Active Problem List   Diagnosis Date Noted   Hyperlipidemia 05/01/2022   Horse bite 02/17/2022   Breast wound 02/17/2022   Hx of adenomatous polyp of colon 06/27/2018   IBS (irritable bowel syndrome) 07/29/2012    PCP: Creola Corn, MD  REFERRING PROVIDER: Creola Corn, MD  REFERRING DIAG: R42 (ICD-10-CM) - Dizziness and giddiness   THERAPY DIAG:  Dizziness and giddiness  Unsteadiness on feet  ONSET DATE: 01/01/2023  Rationale for Evaluation and Treatment: Rehabilitation  SUBJECTIVE:   SUBJECTIVE STATEMENT: Has been working on the exercises at home. Feels like there are brief instances where dizziness could come back, but it doesn't.    Pt accompanied by: self  PERTINENT HISTORY: PMH: Anxiety, Fibromyalgia, Concussion 10/2017 and 2021   PAIN:  Are you having pain? No  There were no vitals filed for this visit.  PRECAUTIONS: None  WEIGHT BEARING RESTRICTIONS: No  FALLS: Has patient fallen in last 6 months? No  PLOF: Leisure: Rides horses everyday   PATIENT GOALS: wants to have tools in tool belt, that when this happens, wants to know what to do.   OBJECTIVE:    COGNITION: Overall cognitive status: Within functional  limits for tasks assessed    GAIT: Gait pattern: [facility] Distance walked: Clinic distances  Assistive device utilized: None Level of assistance: Complete Independence Comments: Pt does report veering during gait on worse days.    VESTIBULAR TREATMENT:                                                                                                   DATE: 01/24/23  NMR:  Gaze Adaptation: x1 Viewing Horizontal: Position: Standing, Time: 60 seconds, Reps: 1, and Comment: performed another 30 seconds with a busy background, no dizziness  x1 Viewing Vertical:  Position: Standing, Time: 30, Reps: 2, and Comment:performed another 30 seconds with a busy background, no dizziness    On air  ex: Feet narrow BOS holding ball and making CW and CCW circles 2 x 10 reps, pt with mild dizziness and unsteadiness with this  Feet narrow BOS holding ball and making diagonals x10 reps each direction  2 x 5 reps sit <> stands with EC, pt initially more unsteady and then improved with incr reps   Working on habituation to bending:  With 5 cones forward, bending down to pick them up, x2 sets, mild dizziness  With 4 cones in front of air ex and behind air ex, picking up cone and performing 180 deg turn to pick up other cone x3 reps Mild dizziness with bending, with each set, pt able to improve her sped   On rockerboard in A/P direction: EO head turns x10 reps, head nods x10 reps, diagonal head turns x10 reps in each direction, pt with incr unsteadiness and needing intermittent UE support  Tossing and catching ball x25 reps for visual tracking, pt needing grab onto // bars at times for balance  EC: Holding board still and trying to maintain balance 4 x 30 seconds, pt initially needing to grab onto bars and then with last rep, pt able to perform without UE support    PATIENT EDUCATION: Education details: Adding bending for habituation to HEP and progressing VOR to a busy background for home, purpose of vestibular exercises  Person educated: Patient Education method: Explanation, Demonstration, and Handouts Education comprehension: verbalized understanding and returned demonstration  HOME EXERCISE PROGRAM: Standing VOR x30 seconds - busy background Standing on cushion bending down and picking up cones   Access Code: [email or web address] URL: [email or web address] Date: 01/19/2023 Prepared by: Sherlie Ban  Exercises - Brandt-Daroff Vestibular Exercise  - 2 x daily - 7 x weekly - 1 sets - 10 reps - Romberg Stance Eyes Closed on Foam Pad  - 2 x daily - 5 x weekly - 2 sets - 10 reps - Standing March  - 2 x daily - 5 x weekly - 2 sets - 10 reps  GOALS: Goals reviewed with patient?  Yes  SHORT TERM GOALS: ALL STGS= LTGS  LONG TERM GOALS: Target date: 02/12/2023  Pt will be independent with final HEP for vestibular deficits in order to build upon functional gains made in therapy. Baseline:  Goal status: INITIAL  2.  Pt will improve composite score and vestibular sensory analysis to  WNL  Baseline: both below normal  Goal status: INITIAL  3.  Pt will perform items on MSQ rating a 0/5 in order to demo improved motion sensitivity.  Baseline: 1-2/5 Goal status: INITIAL  4.  Pt will perform DVA with 1 line difference or less with no dizziness in order to demo improved VOR.  Baseline: 2 line difference and mild dizziness.  Goal status: INITIAL    ASSESSMENT:  CLINICAL IMPRESSION: Today's skilled session continued to focus on progressing VOR tasks and vestibular input for balance. Worked on habituation to bending/turns with pt standing on air ex with mild dizziness. With incr reps, pt able to perform movement more quickly. Overall, since dizziness started, pt is more steady with bending. Pt most challenged with EC tasks on air ex, but improves with incr reps and practice. Initially, needing frequent UE support for balance and then none. Will continue to progress towards LTGs.    OBJECTIVE IMPAIRMENTS: decreased activity tolerance, decreased balance, and dizziness.   ACTIVITY LIMITATIONS: bending, transfers, locomotion level, and sit > stand, moving quickly   PARTICIPATION LIMITATIONS:  doing bending tasks to take care of her horse   PERSONAL FACTORS: Age, Past/current experiences, Time since onset of injury/illness/exacerbation, and 1-2 comorbidities: Anxiety, Fibromyalgia, Concussion 10/2017 and 2021   are also affecting patient's functional outcome.   REHAB POTENTIAL: Good  CLINICAL DECISION MAKING: Stable/uncomplicated  EVALUATION COMPLEXITY: Low   PLAN:  PT FREQUENCY: 1-2x/week  PT DURATION: 4 weeks  PLANNED INTERVENTIONS: Therapeutic exercises,  Therapeutic activity, Neuromuscular re-education, Balance training, Gait training, Patient/Family education, Self Care, Joint mobilization, Vestibular training, Canalith repositioning, and Re-evaluation  PLAN FOR NEXT SESSION: VOR, balance with EC, work on bending tasks, head motions    Drake Leach, PT, DPT  01/24/2023, 11:00 AM

## 2023-02-02 ENCOUNTER — Encounter: Payer: Self-pay | Admitting: Physical Therapy

## 2023-02-02 ENCOUNTER — Ambulatory Visit: Payer: Medicare PPO | Admitting: Physical Therapy

## 2023-02-02 DIAGNOSIS — R42 Dizziness and giddiness: Secondary | ICD-10-CM

## 2023-02-02 DIAGNOSIS — R2681 Unsteadiness on feet: Secondary | ICD-10-CM | POA: Diagnosis not present

## 2023-02-02 NOTE — Therapy (Signed)
OUTPATIENT PHYSICAL THERAPY VESTIBULAR TREATMENT     Patient Name: Rebecca Chapman MRN: [id number] DOB:[birth year], 67 y.o., female Today's Date: 02/02/2023  END OF SESSION:  PT End of Session - 02/02/23 1022     Visit Number 4    Number of Visits 5    Date for PT Re-Evaluation 02/14/23    Authorization Type [employer] - Approved 5 visits 01/19/24 - 02/07/23    PT Start Time 1020    PT Stop Time 1101    PT Time Calculation (min) 41 min    Activity Tolerance Patient tolerated treatment well    Behavior During Therapy [facility] for tasks assessed/performed              Past Medical History:  Diagnosis Date   Acute ischemic colitis (HCC) 07/11/2012   Anxiety    Blood transfusion without reported diagnosis    1997 due to childbirth   Concussion 10/2017   Depression    Diverticulosis    Fibromyalgia    Heart murmur    History of colon polyps    Hyperplastic    Hx of adenomatous polyp of colon 06/27/2018   Insomnia    Shoulder fracture, left, closed, initial encounter    Situational anxiety    Wears contact lenses    Past Surgical History:  Procedure Laterality Date   ABDOMINAL HYSTERECTOMY     BROW LIFT Bilateral 09/17/2014   Procedure: BILATERAL UPPER BLEPHAROPLASTY;  Surgeon: Karie Fetch, MD;  Location: [facility];  Service: Plastics;  Laterality: Bilateral;   CATARACT EXTRACTION Bilateral    Feb 2021 and May 2021   CESAREAN SECTION     x2   COLONOSCOPY W/ BIOPSIES  09/20/07   DILATION AND CURETTAGE OF UTERUS     x3   HYSTEROTOMY     TONSILECTOMY, ADENOIDECTOMY, BILATERAL MYRINGOTOMY AND TUBES     pt doesn't think she had tubes placed, maybe as a child?   TONSILLECTOMY     TOTAL ABDOMINAL HYSTERECTOMY W/ BILATERAL SALPINGOOPHORECTOMY  2007   TOTAL SHOULDER ARTHROPLASTY Left 09/27/2020   Procedure: TOTAL SHOULDER ARTHROPLASTY;  Surgeon: Bjorn Pippin, MD;  Location: [facility];  Service: Orthopedics;  Laterality: Left;    WISDOM TOOTH EXTRACTION     Patient Active Problem List   Diagnosis Date Noted   Hyperlipidemia 05/01/2022   Horse bite 02/17/2022   Breast wound 02/17/2022   Hx of adenomatous polyp of colon 06/27/2018   IBS (irritable bowel syndrome) 07/29/2012    PCP: Creola Corn, MD  REFERRING PROVIDER: Creola Corn, MD  REFERRING DIAG: R42 (ICD-10-CM) - Dizziness and giddiness   THERAPY DIAG:  Dizziness and giddiness  Unsteadiness on feet  ONSET DATE: 01/01/2023  Rationale for Evaluation and Treatment: Rehabilitation  SUBJECTIVE:   SUBJECTIVE STATEMENT: Reports dizziness came back on Tuesday morning. Reports that it lasts all day. Has not done her exercises. Tried the [employer] exercise and it did not help. Reports turning head side to side or bending down is bad. Felt like she was gonna fall if she did her exercises. Feels like it is always there when it comes back. Took a Meclizine and that made her more tired. Does not feel nauseous. On Friday, was on her horse and got whipped around but was able to stay on.   Pt accompanied by: self  PERTINENT HISTORY: PMH: Anxiety, Fibromyalgia, Concussion 10/2017 and 2021   PAIN:  Are you having pain? No  There were no  vitals filed for this visit.  PRECAUTIONS: None  WEIGHT BEARING RESTRICTIONS: No  FALLS: Has patient fallen in last 6 months? No  PLOF: Leisure: Rides horses everyday   PATIENT GOALS: wants to have tools in tool belt, that when this happens, wants to know what to do.   OBJECTIVE:    COGNITION: Overall cognitive status: Within functional limits for tasks assessed    GAIT: Gait pattern: WFL Distance walked: Clinic distances  Assistive device utilized: None Level of assistance: Complete Independence Comments: Pt does report veering during gait on worse days.    VESTIBULAR TREATMENT:                                                                                                   DATE: 02/02/23  POSITIONAL  TESTING: Right Dix-Hallpike: no nystagmus and no dizziness Left Dix-Hallpike: no nystagmus and pt reporting feeling weird, no spinning sensation Right Roll Test: no nystagmus Left Roll Test: no nystagmus and pt with some dizziness  Right Sidelying: no nystagmus and pt with dizziness with return to upright Left Sidelying: no nystagmus and pt with dizziness with return to upright  Orthostatics: Supine: 126/74, HR: 63 Sitting: 128/70, HR: 66 Standing: 126/67, HR: 73  Habituation: Rolling to R/L, x5 reps each side, pt more symptomatic when rolling through midline and going to the L, improved with incr reps [employer] x5 reps each side, pt with improvement of symptoms when returning upright  Gaze Adaptation: x1 Viewing Horizontal: Position: Seated, Time: 30 seconds, Reps: 2, and Comment: reports head feels heavy and off x1 Viewing Vertical:  Position: Seated, Time: 30, Reps: 2, and Comment: pt reporting head feeling off   PATIENT EDUCATION: Education details: For HEP for habituation: Austin Miles (only doing 4-5 reps each side 2x a day, as pt was previously doing 20 reps) and repeated rolling for 4-5 reps each side and moving VOR exercises back to seated instead of standing. Adding more appts for 1x week for 4 weeks. Discussed pt continues to be negative for BPPV and educated on purpose of habituation exercise  Person educated: Patient Education method: Explanation, Demonstration, and Handouts Education comprehension: verbalized understanding and returned demonstration  HOME EXERCISE PROGRAM: Standing VOR x30 seconds  Standing on cushion bending down and picking up cones (holding off on this for now)  Access Code: [email or web address] URL: [email or web address] Date: 01/19/2023 Prepared by: Sherlie Ban  Exercises - Brandt-Daroff Vestibular Exercise  - 2 x daily - 7 x weekly - 1 sets - 10 reps - Romberg Stance Eyes Closed on Foam Pad  - 2 x daily - 5 x weekly - 2 sets - 10  reps - Standing March  - 2 x daily - 5 x weekly - 2 sets - 10 reps  GOALS: Goals reviewed with patient? Yes  SHORT TERM GOALS: ALL STGS= LTGS  LONG TERM GOALS: Target date: 02/12/2023  Pt will be independent with final HEP for vestibular deficits in order to build upon functional gains made in therapy. Baseline:  Goal status: INITIAL  2.  Pt will improve composite score and vestibular sensory  analysis to WNL  Baseline: both below normal  Goal status: INITIAL  3.  Pt will perform items on MSQ rating a 0/5 in order to demo improved motion sensitivity.  Baseline: 1-2/5 Goal status: INITIAL  4.  Pt will perform DVA with 1 line difference or less with no dizziness in order to demo improved VOR.  Baseline: 2 line difference and mild dizziness.  Goal status: INITIAL    ASSESSMENT:  CLINICAL IMPRESSION: Pt returns to therapy today feeling more dizzy that started on Tuesday. Re-assessed positional testing with pt continues to be negative for BPPV. Also assessed orthostatics which pt was also negative for. Last Friday, on her horse pt reports she was knocked around, but was able to stay on her horse. Discussed that this could be contributing to her incr symptoms. Tried Francee Piccolo Daroff and repeated rolling for habituation with pt more symptomatic going to the L side, but did improve with incr reps. Also downgraded VOR exercises to seated for HEP instead of standing due to incr symptoms lately. Discussed if pt feels better at home, then can progress back to standing and can add balance exercises back in. At end of session, pt reporting feeling better than when she came in. Will continue to progress towards LTGs.    OBJECTIVE IMPAIRMENTS: decreased activity tolerance, decreased balance, and dizziness.   ACTIVITY LIMITATIONS: bending, transfers, locomotion level, and sit > stand, moving quickly   PARTICIPATION LIMITATIONS:  doing bending tasks to take care of her horse   PERSONAL FACTORS: Age,  Past/current experiences, Time since onset of injury/illness/exacerbation, and 1-2 comorbidities: Anxiety, Fibromyalgia, Concussion 10/2017 and 2021   are also affecting patient's functional outcome.   REHAB POTENTIAL: Good  CLINICAL DECISION MAKING: Stable/uncomplicated  EVALUATION COMPLEXITY: Low   PLAN:  PT FREQUENCY: 1-2x/week  PT DURATION: 4 weeks  PLANNED INTERVENTIONS: Therapeutic exercises, Therapeutic activity, Neuromuscular re-education, Balance training, Gait training, Patient/Family education, Self Care, Joint mobilization, Vestibular training, Canalith repositioning, and Re-evaluation  PLAN FOR NEXT SESSION: check goals, probably re-cert. How is pt's feeling? Habituation tasks, VOR, balance with EC, work on bending tasks, head motions    Drake Leach, PT, DPT  02/02/2023, 11:07 AM

## 2023-02-07 ENCOUNTER — Ambulatory Visit: Payer: Medicare PPO | Admitting: Physical Therapy

## 2023-02-09 ENCOUNTER — Ambulatory Visit: Payer: Medicare PPO | Admitting: Physical Therapy

## 2023-02-09 ENCOUNTER — Encounter: Payer: Self-pay | Admitting: Physical Therapy

## 2023-02-09 DIAGNOSIS — R42 Dizziness and giddiness: Secondary | ICD-10-CM | POA: Diagnosis not present

## 2023-02-09 DIAGNOSIS — R2681 Unsteadiness on feet: Secondary | ICD-10-CM | POA: Diagnosis not present

## 2023-02-09 NOTE — Therapy (Signed)
OUTPATIENT PHYSICAL THERAPY VESTIBULAR TREATMENT/RE-CERT     Patient Name: Rebecca Chapman MRN: [id number] DOB:October 26, 1955, 67 y.o., female Today's Date: 02/09/2023  END OF SESSION:  PT End of Session - 02/09/23 0806     Visit Number 5    Number of Visits 9    Date for PT Re-Evaluation 03/11/23    Authorization Type [employer] - Approved 5 visits 01/19/24 - 02/07/23    PT Start Time 0804    PT Stop Time 0844    PT Time Calculation (min) 40 min    Activity Tolerance Patient tolerated treatment well    Behavior During Therapy [facility] for tasks assessed/performed              Past Medical History:  Diagnosis Date   Acute ischemic colitis (HCC) 07/11/2012   Anxiety    Blood transfusion without reported diagnosis    1997 due to childbirth   Concussion 10/2017   Depression    Diverticulosis    Fibromyalgia    Heart murmur    History of colon polyps    Hyperplastic    Hx of adenomatous polyp of colon 06/27/2018   Insomnia    Shoulder fracture, left, closed, initial encounter    Situational anxiety    Wears contact lenses    Past Surgical History:  Procedure Laterality Date   ABDOMINAL HYSTERECTOMY     BROW LIFT Bilateral 09/17/2014   Procedure: BILATERAL UPPER BLEPHAROPLASTY;  Surgeon: Karie Fetch, MD;  Location: [facility];  Service: Plastics;  Laterality: Bilateral;   CATARACT EXTRACTION Bilateral    Feb 2021 and May 2021   CESAREAN SECTION     x2   COLONOSCOPY W/ BIOPSIES  09/20/07   DILATION AND CURETTAGE OF UTERUS     x3   HYSTEROTOMY     TONSILECTOMY, ADENOIDECTOMY, BILATERAL MYRINGOTOMY AND TUBES     pt doesn't think she had tubes placed, maybe as a child?   TONSILLECTOMY     TOTAL ABDOMINAL HYSTERECTOMY W/ BILATERAL SALPINGOOPHORECTOMY  2007   TOTAL SHOULDER ARTHROPLASTY Left 09/27/2020   Procedure: TOTAL SHOULDER ARTHROPLASTY;  Surgeon: Bjorn Pippin, MD;  Location: [facility];  Service: Orthopedics;  Laterality:  Left;   WISDOM TOOTH EXTRACTION     Patient Active Problem List   Diagnosis Date Noted   Hyperlipidemia 05/01/2022   Horse bite 02/17/2022   Breast wound 02/17/2022   Hx of adenomatous polyp of colon 06/27/2018   IBS (irritable bowel syndrome) 07/29/2012    PCP: Creola Corn, MD  REFERRING PROVIDER: Creola Corn, MD  REFERRING DIAG: R42 (ICD-10-CM) - Dizziness and giddiness   THERAPY DIAG:  Dizziness and giddiness  Unsteadiness on feet  ONSET DATE: 01/01/2023  Rationale for Evaluation and Treatment: Rehabilitation  SUBJECTIVE:   SUBJECTIVE STATEMENT: Reports dizziness is doing better, the exercises helped. Last time she felt it was Sunday.   Pt accompanied by: self  PERTINENT HISTORY: PMH: Anxiety, Fibromyalgia, Concussion 10/2017 and 2021   PAIN:  Are you having pain? No  There were no vitals filed for this visit.  PRECAUTIONS: None  WEIGHT BEARING RESTRICTIONS: No  FALLS: Has patient fallen in last 6 months? No  PLOF: Leisure: Rides horses everyday   PATIENT GOALS: wants to have tools in tool belt, that when this happens, wants to know what to do.   OBJECTIVE:    COGNITION: Overall cognitive status: Within functional limits for tasks assessed    GAIT: Gait pattern: [facility] Distance  walked: Clinic distances  Assistive device utilized: None Level of assistance: Complete Independence Comments: Pt does report veering during gait on worse days.    VESTIBULAR TREATMENT:                                                                                                   DATE: 02/09/23  MOTION SENSITIVITY:  Motion Sensitivity Quotient Intensity: 0 = none, 1 = Lightheaded, 2 = Mild, 3 = Moderate, 4 = Severe, 5 = Vomiting  Intensity  1. Sitting to supine 0  2. Supine to L side 0  3. Supine to R side 0  4. Supine to sitting 2  5. L Hallpike-Dix 1  6. Up from L  0  7. R Hallpike-Dix 1  8. Up from R  0  9. Sitting, head tipped to L knee 0  10. Head up from  L knee 0  11. Sitting, head tipped to R knee 0  12. Head up from R knee 0  13. Standing head turns x5 0  14.Standing head nods x5 0  15. In stance, 180 turn to L  0  16. In stance, 180 turn to R 0      Dynamic Visual Acuity: Static: Line 10 Dynamic: Line 9 1 line difference, no symptoms     NMR: On air ex: Feet together EC 2 x 30 seconds, mild postural sway  With feet hip width EC 2 x 10 reps head turns, 2 x 10 reps head nods 2 x 5 reps sit <> stands EC  Walking and holding ball and making CW and CCW circles, 3 x 20' each direction, pt with unsteadiness, but able to keep balance. CGA as needed. Pt with no dizziness, just feeling off balance    PATIENT EDUCATION: Education details: Continue HEP, results of goals, purpose of working on vestibular exercises going forwards and can use habituation exercises to help manage symptoms, will plan to maintain POC for an additional 1x week for 4 weeks and may D/C early based on progress  Person educated: Patient Education method: Explanation, Demonstration, and Handouts Education comprehension: verbalized understanding and returned demonstration  HOME EXERCISE PROGRAM: Standing VOR x30 seconds  Standing on cushion bending down and picking up cones (holding off on this for now)  Gaze stabilization: standing on air ex holding ball and CW/CCW   Access Code: [email or web address] URL: [email or web address] Date: 01/19/2023 Prepared by: Sherlie Ban  Exercises - Brandt-Daroff Vestibular Exercise  - 2 x daily - 7 x weekly - 1 sets - 10 reps - Romberg Stance Eyes Closed on Foam Pad  - 2 x daily - 5 x weekly - 2 sets - 10 reps - Standing March  - 2 x daily - 5 x weekly - 2 sets - 10 reps  GOALS: Goals reviewed with patient? Yes  SHORT TERM GOALS: ALL STGS= LTGS  LONG TERM GOALS: Target date: 02/12/2023  Pt will be independent with final HEP for vestibular deficits in order to build upon functional gains made in therapy. Baseline:   Goal status: MET  2.  Pt will  improve composite score and vestibular sensory analysis to WNL  Baseline: unable to assess on 02/09/23 due to machine not working  Goal status: INITIAL  3.  Pt will perform items on MSQ rating a 0/5 in order to demo improved motion sensitivity.  Baseline: 1-2/5  Mainly 0/5, only 1-2 items of 1/5 Goal status: PARTIALLY MET  4.  Pt will perform DVA with 1 line difference or less with no dizziness in order to demo improved VOR.  Baseline: 2 line difference and mild dizziness.   1 line difference, no dizziness  Goal status: MET  Updated/on-going LTGs for re-cert:  LONG TERM GOALS: Target date: 03/09/2023  1.  Pt will improve composite score and vestibular sensory analysis to WNL  Baseline: unable to assess on 02/09/23 due to machine not working  Goal status: INITIAL  2.  Pt will perform items on MSQ rating a 0/5 in order to demo improved motion sensitivity.  Baseline: 1-2/5  Mainly 0/5, only 1-2 items of 1/5 Goal status: ON-GOING    ASSESSMENT:  CLINICAL IMPRESSION: Pt reporting feeling better with dizziness since she was last here. Assessed LTGs today for re-cert. Unable to assess Sensory Organization test today due to it not working. Pt with improvements on MSQ items. Primarily rating 0/5 for items, but still rating 1-2/5 just for sitting up from supine or DixHallpike position. Educated to continue to work on [employer] for home. Pt met LTG in regards to DVA, indicating improved VOR. Educated to continue to perform HEP for vestibular deficits for balance and motion sensitivity. Plan to re-cert for an additional 1x week for 4 weeks to continue to work on deficits. Anticipate not having to use all visits.    OBJECTIVE IMPAIRMENTS: decreased activity tolerance, decreased balance, and dizziness.   ACTIVITY LIMITATIONS: bending, transfers, locomotion level, and sit > stand, moving quickly   PARTICIPATION LIMITATIONS:  doing bending tasks  to take care of her horse   PERSONAL FACTORS: Age, Past/current experiences, Time since onset of injury/illness/exacerbation, and 1-2 comorbidities: Anxiety, Fibromyalgia, Concussion 10/2017 and 2021   are also affecting patient's functional outcome.   REHAB POTENTIAL: Good  CLINICAL DECISION MAKING: Stable/uncomplicated  EVALUATION COMPLEXITY: Low   PLAN:  PT FREQUENCY: 1-2x/week  PT DURATION: 4 weeks  PLANNED INTERVENTIONS: Therapeutic exercises, Therapeutic activity, Neuromuscular re-education, Balance training, Gait training, Patient/Family education, Self Care, Joint mobilization, Vestibular training, Canalith repositioning, and Re-evaluation  PLAN FOR NEXT SESSION: How is pt's feeling? Habituation tasks, VOR, balance with EC, work on bending tasks, head motions. CHECK SOT IF WORKING!!    Drake Leach, PT, DPT  02/09/2023, 12:23 PM

## 2023-02-21 ENCOUNTER — Encounter: Payer: Self-pay | Admitting: Physical Therapy

## 2023-02-21 ENCOUNTER — Ambulatory Visit: Payer: Medicare PPO | Attending: Internal Medicine | Admitting: Physical Therapy

## 2023-02-21 DIAGNOSIS — R2681 Unsteadiness on feet: Secondary | ICD-10-CM | POA: Diagnosis not present

## 2023-02-21 DIAGNOSIS — R42 Dizziness and giddiness: Secondary | ICD-10-CM | POA: Insufficient documentation

## 2023-02-21 NOTE — Therapy (Signed)
OUTPATIENT PHYSICAL THERAPY VESTIBULAR TREATMENT     Patient Name: Rebecca Chapman MRN: [id number] DOB:March 30, 1956, 67 y.o., female Today's Date: 02/21/2023  END OF SESSION:  PT End of Session - 02/21/23 1108     Visit Number 6    Number of Visits 9    Date for PT Re-Evaluation 03/11/23    Authorization Type [employer] - Approved 5 visits 01/19/24 - 02/07/23    PT Start Time 1107   PT running late   PT Stop Time 1150    PT Time Calculation (min) 43 min    Activity Tolerance Patient tolerated treatment well    Behavior During Therapy [facility] for tasks assessed/performed              Past Medical History:  Diagnosis Date   Acute ischemic colitis (HCC) 07/11/2012   Anxiety    Blood transfusion without reported diagnosis    1997 due to childbirth   Concussion 10/2017   Depression    Diverticulosis    Fibromyalgia    Heart murmur    History of colon polyps    Hyperplastic    Hx of adenomatous polyp of colon 06/27/2018   Insomnia    Shoulder fracture, left, closed, initial encounter    Situational anxiety    Wears contact lenses    Past Surgical History:  Procedure Laterality Date   ABDOMINAL HYSTERECTOMY     BROW LIFT Bilateral 09/17/2014   Procedure: BILATERAL UPPER BLEPHAROPLASTY;  Surgeon: Karie Fetch, MD;  Location: [facility];  Service: Plastics;  Laterality: Bilateral;   CATARACT EXTRACTION Bilateral    Feb 2021 and May 2021   CESAREAN SECTION     x2   COLONOSCOPY W/ BIOPSIES  09/20/07   DILATION AND CURETTAGE OF UTERUS     x3   HYSTEROTOMY     TONSILECTOMY, ADENOIDECTOMY, BILATERAL MYRINGOTOMY AND TUBES     pt doesn't think she had tubes placed, maybe as a child?   TONSILLECTOMY     TOTAL ABDOMINAL HYSTERECTOMY W/ BILATERAL SALPINGOOPHORECTOMY  2007   TOTAL SHOULDER ARTHROPLASTY Left 09/27/2020   Procedure: TOTAL SHOULDER ARTHROPLASTY;  Surgeon: Bjorn Pippin, MD;  Location: [facility];  Service: Orthopedics;   Laterality: Left;   WISDOM TOOTH EXTRACTION     Patient Active Problem List   Diagnosis Date Noted   Hyperlipidemia 05/01/2022   Horse bite 02/17/2022   Breast wound 02/17/2022   Hx of adenomatous polyp of colon 06/27/2018   IBS (irritable bowel syndrome) 07/29/2012    PCP: Creola Corn, MD  REFERRING PROVIDER: Creola Corn, MD  REFERRING DIAG: R42 (ICD-10-CM) - Dizziness and giddiness   THERAPY DIAG:  Dizziness and giddiness  Unsteadiness on feet  ONSET DATE: 01/01/2023  Rationale for Evaluation and Treatment: Rehabilitation  SUBJECTIVE:   SUBJECTIVE STATEMENT: Has a few mild dizziness episodes at the beach. Unsure what happened, just one of those moments. Had some pressure in her head and swimmy. Yesterday was on her house and had a dizzy spell, had to have someone help her get off her horse. Had to sit down and had a fan on her. Went straight home and checked her BP and it was ok. Tracked her heart rate on her apple watch and noticed her HR increased when this episode happened. Looked back of history on HR and noticed some random blips. Reports the balance feels better and the exercises have helped some, but still having episodes.   Pt accompanied by:  self  PERTINENT HISTORY: PMH: Anxiety, Fibromyalgia, Concussion 10/2017 and 2021   PAIN:  Are you having pain? No  There were no vitals filed for this visit.  PRECAUTIONS: None  WEIGHT BEARING RESTRICTIONS: No  FALLS: Has patient fallen in last 6 months? No  PLOF: Leisure: Rides horses everyday   PATIENT GOALS: wants to have tools in tool belt, that when this happens, wants to know what to do.   OBJECTIVE:    COGNITION: Overall cognitive status: Within functional limits for tasks assessed    GAIT: Gait pattern: WFL Distance walked: Clinic distances  Assistive device utilized: None Level of assistance: Complete Independence Comments: Pt does report veering during gait on worse days.    VESTIBULAR  TREATMENT:                                                                                                   DATE: 02/21/23  SOT:  Conditions: 1: 3 trials WNL 2: 3 trials WNL 3:  3 trials WNL 4: 3 trials below normal 5: 2 trials below normal, 1 WNL 6: 2 falls, 1 WNL Composite score: 75 (below age related norms) Sensory Analysis Som: WNL Vis: Below normal Vest: Below normal Pref: Below normal Strategy analysis: Ankle > hip strategy COG alignment: Pt with COG anteriorly    NMR: On air ex: Feet together EO: 2 x 10 reps head turns, 2 x 10 reps head nods, with mild postural sway On rockerboard in A/P direction: EO 10 reps head turns, 10 reps head nods, 10 reps diagonal head motions in each direction, incr postural sway    PATIENT EDUCATION: Education details: Results of SOT, areas therapy can continue to address for balance and how based on SOT findings pt can still be feeling off, adding EO and head motions to foam for HEP (as well as continue with EC), following up with PCP regarding pt noticing intermittent high HR readings on Apple Watch (yesterday coincided with a dizziness episode) and to keep note of this going forwards if there are any other patterns.  Person educated: Patient Education method: Explanation, Demonstration, and Handouts Education comprehension: verbalized understanding and returned demonstration  HOME EXERCISE PROGRAM: Standing VOR x30 seconds  Standing on cushion bending down and picking up cones (holding off on this for now) Eyes open on foam with feet together: 10 reps head turns, 10 reps head nods   Gaze stabilization: standing on air ex holding ball and CW/CCW   Access Code: [email or web address] URL: [email or web address] Date: 01/19/2023 Prepared by: Sherlie Ban  Exercises - Brandt-Daroff Vestibular Exercise  - 2 x daily - 7 x weekly - 1 sets - 10 reps - Romberg Stance Eyes Closed on Foam Pad  - 2 x daily - 5 x weekly - 2 sets - 10 reps -  Standing March  - 2 x daily - 5 x weekly - 2 sets - 10 reps  GOALS: Goals reviewed with patient? Yes  SHORT TERM GOALS: ALL STGS= LTGS  LONG TERM GOALS: Target date: 02/12/2023  Pt will be independent with final HEP for vestibular deficits in  order to build upon functional gains made in therapy. Baseline:  Goal status: MET  2.  Pt will improve composite score and vestibular sensory analysis to WNL  Baseline: unable to assess on 02/09/23 due to machine not working  Goal status: INITIAL  3.  Pt will perform items on MSQ rating a 0/5 in order to demo improved motion sensitivity.  Baseline: 1-2/5  Mainly 0/5, only 1-2 items of 1/5 Goal status: PARTIALLY MET  4.  Pt will perform DVA with 1 line difference or less with no dizziness in order to demo improved VOR.  Baseline: 2 line difference and mild dizziness.   1 line difference, no dizziness  Goal status: MET  Updated/on-going LTGs for re-cert:  LONG TERM GOALS: Target date: 03/09/2023  1.  Pt will improve composite score and vestibular sensory analysis to WNL  Baseline: unable to assess on 02/09/23 due to machine not working  Goal status: INITIAL  2.  Pt will perform items on MSQ rating a 0/5 in order to demo improved motion sensitivity.  Baseline: 1-2/5  Mainly 0/5, only 1-2 items of 1/5 Goal status: ON-GOING  3.  Pt will improve composite score and vestibular sensory analysis to WNL  Baseline: unable to assess on 02/09/23 due to machine not working  Goal status: INITIAL    ASSESSMENT:  CLINICAL IMPRESSION: Pt reporting having some intermittent dizziness episodes since she was last here at the beach. Has been working on her exercises at home. Also noted having a dizzy spell on her horse yesterday and someone had to help get her off. When looking at her Apple Watch, noted a spike in her HR during this time to the ~130 bpms and has noticed previous random spikes while looking at HR information on her watch (spikes that do not  correlate to activity). Pt to monitor this to see if there are more episodes and is going to let PCP know. PT assessed SOT testing with pt scoring below normal for composite score and for visual and vestibular system on sensory analysis. See above for further information. Discussed how this areas can still be affecting pt's balance and feeling off/having dizziness episodes. Educated to continue to work on [employer] and added balance with head motions with EO. Will continue per POC.     OBJECTIVE IMPAIRMENTS: decreased activity tolerance, decreased balance, and dizziness.   ACTIVITY LIMITATIONS: bending, transfers, locomotion level, and sit > stand, moving quickly   PARTICIPATION LIMITATIONS:  doing bending tasks to take care of her horse   PERSONAL FACTORS: Age, Past/current experiences, Time since onset of injury/illness/exacerbation, and 1-2 comorbidities: Anxiety, Fibromyalgia, Concussion 10/2017 and 2021   are also affecting patient's functional outcome.   REHAB POTENTIAL: Good  CLINICAL DECISION MAKING: Stable/uncomplicated  EVALUATION COMPLEXITY: Low   PLAN:  PT FREQUENCY: 1-2x/week  PT DURATION: 4 weeks  PLANNED INTERVENTIONS: Therapeutic exercises, Therapeutic activity, Neuromuscular re-education, Balance training, Gait training, Patient/Family education, Self Care, Joint mobilization, Vestibular training, Canalith repositioning, and Re-evaluation  PLAN FOR NEXT SESSION: How is pt's feeling?  Habituation tasks, VOR, balance with EC, work on bending tasks, head motions.  Work on rockerboard and [employer] for [employer], PT, DPT  02/21/2023, 2:04 PM

## 2023-02-22 DIAGNOSIS — F419 Anxiety disorder, unspecified: Secondary | ICD-10-CM | POA: Diagnosis not present

## 2023-02-22 DIAGNOSIS — R42 Dizziness and giddiness: Secondary | ICD-10-CM | POA: Diagnosis not present

## 2023-02-22 DIAGNOSIS — R519 Headache, unspecified: Secondary | ICD-10-CM | POA: Diagnosis not present

## 2023-02-26 ENCOUNTER — Encounter: Payer: Self-pay | Admitting: Internal Medicine

## 2023-02-27 ENCOUNTER — Telehealth: Payer: Self-pay | Admitting: Pharmacist

## 2023-02-27 ENCOUNTER — Other Ambulatory Visit: Payer: Self-pay | Admitting: Registered Nurse

## 2023-02-27 DIAGNOSIS — R42 Dizziness and giddiness: Secondary | ICD-10-CM

## 2023-02-27 NOTE — Telephone Encounter (Signed)
Patient called yesterday's 7/15 reporting that she thinks she may be having a side effect to Repatha.  Called patient back today no answer left voicemail for patient to return call.  Patient had sent message to Dr. Tenny Craw about experiencing dizziness.  I did find 1 trial for Repatha where dizziness was reported in 3% of people on Repatha and 2% of people on placebo.  This child was with the 420 mg dose.

## 2023-02-28 ENCOUNTER — Encounter: Payer: Self-pay | Admitting: Pharmacist

## 2023-02-28 ENCOUNTER — Ambulatory Visit: Payer: Medicare PPO | Admitting: Physical Therapy

## 2023-02-28 ENCOUNTER — Ambulatory Visit: Admission: RE | Admit: 2023-02-28 | Payer: Medicare PPO | Source: Ambulatory Visit

## 2023-02-28 ENCOUNTER — Encounter: Payer: Self-pay | Admitting: Physical Therapy

## 2023-02-28 DIAGNOSIS — R42 Dizziness and giddiness: Secondary | ICD-10-CM

## 2023-02-28 DIAGNOSIS — R2681 Unsteadiness on feet: Secondary | ICD-10-CM

## 2023-02-28 NOTE — Therapy (Addendum)
OUTPATIENT PHYSICAL THERAPY VESTIBULAR TREATMENT     Patient Name: Rebecca Chapman MRN: [id number] DOB:May 02, 1956, 67 y.o., female Today's Date: 02/28/2023  END OF SESSION:  PT End of Session - 02/28/23 1019     Visit Number 7    Number of Visits 9    Date for PT Re-Evaluation 03/11/23    Authorization Type [employer] - Approved 5 visits 02/09/2023 - 03/14/2023    PT Start Time 1017    PT Stop Time 1057    PT Time Calculation (min) 40 min    Activity Tolerance Patient tolerated treatment well    Behavior During Therapy [facility] for tasks assessed/performed              Past Medical History:  Diagnosis Date   Acute ischemic colitis (HCC) 07/11/2012   Anxiety    Blood transfusion without reported diagnosis    1997 due to childbirth   Concussion 10/2017   Depression    Diverticulosis    Fibromyalgia    Heart murmur    History of colon polyps    Hyperplastic    Hx of adenomatous polyp of colon 06/27/2018   Insomnia    Shoulder fracture, left, closed, initial encounter    Situational anxiety    Wears contact lenses    Past Surgical History:  Procedure Laterality Date   ABDOMINAL HYSTERECTOMY     BROW LIFT Bilateral 09/17/2014   Procedure: BILATERAL UPPER BLEPHAROPLASTY;  Surgeon: Karie Fetch, MD;  Location: [facility];  Service: Plastics;  Laterality: Bilateral;   CATARACT EXTRACTION Bilateral    Feb 2021 and May 2021   CESAREAN SECTION     x2   COLONOSCOPY W/ BIOPSIES  09/20/07   DILATION AND CURETTAGE OF UTERUS     x3   HYSTEROTOMY     TONSILECTOMY, ADENOIDECTOMY, BILATERAL MYRINGOTOMY AND TUBES     pt doesn't think she had tubes placed, maybe as a child?   TONSILLECTOMY     TOTAL ABDOMINAL HYSTERECTOMY W/ BILATERAL SALPINGOOPHORECTOMY  2007   TOTAL SHOULDER ARTHROPLASTY Left 09/27/2020   Procedure: TOTAL SHOULDER ARTHROPLASTY;  Surgeon: Bjorn Pippin, MD;  Location: [facility];  Service: Orthopedics;  Laterality: Left;    WISDOM TOOTH EXTRACTION     Patient Active Problem List   Diagnosis Date Noted   Hyperlipidemia 05/01/2022   Horse bite 02/17/2022   Breast wound 02/17/2022   Hx of adenomatous polyp of colon 06/27/2018   IBS (irritable bowel syndrome) 07/29/2012    PCP: Creola Corn, MD  REFERRING PROVIDER: Creola Corn, MD  REFERRING DIAG: R42 (ICD-10-CM) - Dizziness and giddiness   THERAPY DIAG:  Dizziness and giddiness  Unsteadiness on feet  ONSET DATE: 01/01/2023  Rationale for Evaluation and Treatment: Rehabilitation  SUBJECTIVE:   SUBJECTIVE STATEMENT: Saw PCP the other day. Had blood work done and everything was good. Getting imagine of her sinuses after therapy today due to continued feeling of pressure in head. Dizziness feels better from last week, but it is still there. Still feels woozy. Head feels pressure and full.   Pt accompanied by: self  PERTINENT HISTORY: PMH: Anxiety, Fibromyalgia, Concussion 10/2017 and 2021   PAIN:  Are you having pain? No  There were no vitals filed for this visit.  PRECAUTIONS: None  WEIGHT BEARING RESTRICTIONS: No  FALLS: Has patient fallen in last 6 months? No  PLOF: Leisure: Rides horses everyday   PATIENT GOALS: wants to have tools in tool belt, that  when this happens, wants to know what to do.   OBJECTIVE:    COGNITION: Overall cognitive status: Within functional limits for tasks assessed    GAIT: Gait pattern: WFL Distance walked: Clinic distances  Assistive device utilized: None Level of assistance: Complete Independence Comments: Pt does report veering during gait on worse days.    VESTIBULAR TREATMENT:                                                                                                   DATE: 02/28/23   NMR:  Gaze Adaptation: x1 Viewing Horizontal: Position: Standing with busy background, Time: 30 seconds and 60 seconds, Reps: 3, and Comment: initial difficulty keeping eyes on target, but improved, no  dizziness/symptoms  x1 Viewing Vertical:  Position: Standing with busy background, Time: 30 and 60 seconds, Reps: 2, and Comment: with busy background, initial difficulty keeping eyes focused, more challenging than side to side   Forward gait with VOR x1 in horizontal direction 5 x 20' with pt unsteady and having difficulty coordinating initially, then with VOR x1 in vertical direction 4 x 20', pt better with this one, CGA for balance  Forward gait with holding ball and making diagonals in each direction 3 x 20' each direction, no dizziness, just feeling off  3 x 115' forward gait with head turns looking towards card to name suit and number, pt with some dizziness and pt with improved symptoms after each rep, some unsteadiness Forward gait with head turns 2 x 20' and head nods 2 x 20', pt with some unsteadiness but no dizziness   PATIENT EDUCATION: Education details: Updates to HEP (gait with head motions), VOR x1 for 60 seconds with busy background, changing appt for next week  Person educated: Patient Education method: Medical illustrator Education comprehension: verbalized understanding and returned demonstration  HOME EXERCISE PROGRAM: Standing VOR x60 seconds with busy background Standing on cushion bending down and picking up cones (holding off on this for now) Eyes open on foam with feet together: 10 reps head turns, 10 reps head nods   Gait with head turns/nods   Access Code: [email or web address] URL: [email or web address] Date: 01/19/2023 Prepared by: Sherlie Ban  Exercises - Brandt-Daroff Vestibular Exercise  - 2 x daily - 7 x weekly - 1 sets - 10 reps - Romberg Stance Eyes Closed on Foam Pad  - 2 x daily - 5 x weekly - 2 sets - 10 reps - Standing March  - 2 x daily - 5 x weekly - 2 sets - 10 reps  GOALS: Goals reviewed with patient? Yes  SHORT TERM GOALS: ALL STGS= LTGS  LONG TERM GOALS: Target date: 02/12/2023  Pt will be independent with final HEP for  vestibular deficits in order to build upon functional gains made in therapy. Baseline:  Goal status: MET  2.  Pt will improve composite score and vestibular sensory analysis to WNL  Baseline: unable to assess on 02/09/23 due to machine not working  Goal status: INITIAL  3.  Pt will perform items on MSQ rating a 0/5 in order to demo  improved motion sensitivity.  Baseline: 1-2/5  Mainly 0/5, only 1-2 items of 1/5 Goal status: PARTIALLY MET  4.  Pt will perform DVA with 1 line difference or less with no dizziness in order to demo improved VOR.  Baseline: 2 line difference and mild dizziness.   1 line difference, no dizziness  Goal status: MET  Updated/on-going LTGs for re-cert:  LONG TERM GOALS: Target date: 03/09/2023  1.  Pt will improve composite score and vestibular sensory analysis to WNL  Baseline: unable to assess on 02/09/23 due to machine not working  Goal status: INITIAL  2.  Pt will perform items on MSQ rating a 0/5 in order to demo improved motion sensitivity.  Baseline: 1-2/5  Mainly 0/5, only 1-2 items of 1/5 Goal status: ON-GOING  3.  Pt will improve composite score and vestibular sensory analysis to WNL  Baseline: unable to assess on 02/09/23 due to machine not working  Goal status: INITIAL    ASSESSMENT:  CLINICAL IMPRESSION: Today's skilled session focused on balance with head motions, balance for vestibular input, and VOR tasks. Pt reporting no dizziness with majority of tasks, mainly feel off balance. Pt feeling some dizziness after gait with head turns with naming cards, but symptoms and balance improved with incr reps. Added gait with head motions to HEP and progressed standing VOR x1 to a busy background and for 60 seconds. Will continue to progress towards LTGs.    OBJECTIVE IMPAIRMENTS: decreased activity tolerance, decreased balance, and dizziness.   ACTIVITY LIMITATIONS: bending, transfers, locomotion level, and sit > stand, moving quickly    PARTICIPATION LIMITATIONS:  doing bending tasks to take care of her horse   PERSONAL FACTORS: Age, Past/current experiences, Time since onset of injury/illness/exacerbation, and 1-2 comorbidities: Anxiety, Fibromyalgia, Concussion 10/2017 and 2021   are also affecting patient's functional outcome.   REHAB POTENTIAL: Good  CLINICAL DECISION MAKING: Stable/uncomplicated  EVALUATION COMPLEXITY: Low   PLAN:  PT FREQUENCY: 1-2x/week  PT DURATION: 4 weeks  PLANNED INTERVENTIONS: Therapeutic exercises, Therapeutic activity, Neuromuscular re-education, Balance training, Gait training, Patient/Family education, Self Care, Joint mobilization, Vestibular training, Canalith repositioning, and Re-evaluation  PLAN FOR NEXT SESSION: How is pt's feeling?  Habituation tasks, VOR, balance with EC, work on bending tasks, head motions.  Work on rockerboard and [employer] for [employer], PT, DPT  02/28/2023, 11:02 AM

## 2023-03-07 ENCOUNTER — Encounter: Payer: Medicare PPO | Admitting: Physical Therapy

## 2023-03-09 ENCOUNTER — Ambulatory Visit: Payer: Medicare PPO | Admitting: Physical Therapy

## 2023-03-14 ENCOUNTER — Ambulatory Visit: Payer: Medicare PPO | Admitting: Physical Therapy

## 2023-03-20 ENCOUNTER — Telehealth: Payer: Self-pay | Admitting: *Deleted

## 2023-03-20 NOTE — Telephone Encounter (Signed)
Patient scheduled for a colonoscopy 05/01/23-- she is not due until 2026 ( 7 yr recall from colonoscopy from 2019) Left message indicating this information for patient to call back if she had any additional questions or concerns.

## 2023-03-23 NOTE — Telephone Encounter (Signed)
PT called back to confirm that she received message about her recall and she is okay with that.

## 2023-04-01 NOTE — Progress Notes (Unsigned)
Cardiology Office Note   Date:  04/02/2023   ID:  Rebecca Chapman, DOB 1955-11-14, MRN [id number]  PCP:  Creola Corn, MD  Cardiologist:   Dietrich Pates, MD    Pt referred for evaluation of CAD    History of Present Illness: Rebecca Chapman is a 67 y.o. female who presents for evalaution of CAD IN Dec 2022 had CT calcium score   Score was 305 (93%ile)   287 in LAD; 18 in RCA   FHx of CA   I saw the pt in June 2023  Labs:   Lpa 122 LDL 100 with 1004 particles     Tried lipitor since did not tolerate crestor in the past    She dId not tolerate  so switched to Repatha  Last lipids in Dec 2023 LDL 43, HDL 115  LDL particle 310  Lpa 86   The pt presents today for return   She says initially with switch to Repatha she did OK Then a few months in she started feeling bad. PT would feel dizzy, with brain fog.  BP would be labile, often high (160 to 180)  Felt fullness in head, like sinus trouble     Seen by neuro  Did PT On reflection the pt says she would take Repatha injection on Sunday   ON Tuesday symptoms started    Over the next 2 wks te symptoms would eventually ease.  Start again with next injection    On 02/27/23 the pt stopped Repatha   Since then she has felt much better   SYmptoms gone  Denied rash  No GI symptoms   She is active   Denies CP  Breathing is good      No outpatient medications have been marked as taking for the 04/02/23 encounter (Office Visit) with Pricilla Riffle, MD.     Allergies:   Avelox [moxifloxacin], Other, Repatha [evolocumab], and Sulfa antibiotics   Past Medical History:  Diagnosis Date   Acute ischemic colitis (HCC) 07/11/2012   Anxiety    Blood transfusion without reported diagnosis    1997 due to childbirth   Concussion 10/2017   Depression    Diverticulosis    Fibromyalgia    Heart murmur    History of colon polyps    Hyperplastic    Hx of adenomatous polyp of colon 06/27/2018   Insomnia    Shoulder fracture, left, closed, initial  encounter    Situational anxiety    Wears contact lenses     Past Surgical History:  Procedure Laterality Date   ABDOMINAL HYSTERECTOMY     BROW LIFT Bilateral 09/17/2014   Procedure: BILATERAL UPPER BLEPHAROPLASTY;  Surgeon: Karie Fetch, MD;  Location: [facility];  Service: Plastics;  Laterality: Bilateral;   CATARACT EXTRACTION Bilateral    Feb 2021 and May 2021   CESAREAN SECTION     x2   COLONOSCOPY W/ BIOPSIES  09/20/07   DILATION AND CURETTAGE OF UTERUS     x3   HYSTEROTOMY     TONSILECTOMY, ADENOIDECTOMY, BILATERAL MYRINGOTOMY AND TUBES     pt doesn't think she had tubes placed, maybe as a child?   TONSILLECTOMY     TOTAL ABDOMINAL HYSTERECTOMY W/ BILATERAL SALPINGOOPHORECTOMY  2007   TOTAL SHOULDER ARTHROPLASTY Left 09/27/2020   Procedure: TOTAL SHOULDER ARTHROPLASTY;  Surgeon: Bjorn Pippin, MD;  Location: [facility];  Service: Orthopedics;  Laterality: Left;   WISDOM TOOTH EXTRACTION  Social History:  The patient  reports that she quit smoking about 34 years ago. Her smoking use included cigarettes. She started smoking about 39 years ago. She has a 2.5 pack-year smoking history. She has never used smokeless tobacco. She reports current alcohol use of about 14.0 standard drinks of alcohol per week. She reports that she does not use drugs.   Family History:  The patient's family history includes Breast cancer (age of onset: 28) in her sister; Heart disease in her father; Lupus in her sister; Multiple sclerosis in her mother; Other in her sister; Suicidality in her mother.    ROS:  Please see the history of present illness. All other systems are reviewed and  Negative to the above problem except as noted.    PHYSICAL EXAM: VS:  BP 110/66   Pulse (!) 53   Ht 5\' 6"  (1.676 m)   Wt 117 lb (53.1 kg)   SpO2 99%   BMI 18.88 kg/m   GEN: Well nourished, well developed, in no acute distress  HEENT: normal  Neck: no JVD, carotid  bruits Cardiac: RRR; no murmurs.  No LEedema  Respiratory:  clear to auscultation  GI: soft, nontender, nondistended No hepatomegaly  MS: no deformity Moving all extremities   Skin: warm and dry, no rash Neuro:  Strength and sensation are intact Psych: euthymic mood, full affect   EKG:  EKG is ordered today.  SB 53 bpm    Lipid Panel    Component Value Date/Time   CHOL 160 07/18/2022 0843   TRIG 43 07/18/2022 0843   HDL 115 07/18/2022 0843   CHOLHDL 1.4 07/18/2022 0843   LDLCALC 35 07/18/2022 0843      Wt Readings from Last 3 Encounters:  04/02/23 117 lb (53.1 kg)  02/17/22 120 lb (54.4 kg)  02/07/22 120 lb 3.2 oz (54.5 kg)      ASSESSMENT AND PLAN:  1  CAD  CT calcium score identified   NO symptoms of angina   Follow  2.  HL Pt has not tolerated statins  Now had problems with Repatha ? Immune mediated    WIll review with pharmacy   Give drug holiday for now  Let body  regroup.    Current medicines are reviewed at length with the patient today.  The patient does not have concerns regarding medicines.  Signed, Dietrich Pates, MD  04/02/2023 5:34 PM    [facility] [address] [place], [place], [place]  [address] Phone: [phone]; Fax: [phone]

## 2023-04-02 ENCOUNTER — Ambulatory Visit: Payer: Medicare PPO | Attending: Internal Medicine | Admitting: Internal Medicine

## 2023-04-02 ENCOUNTER — Encounter: Payer: Self-pay | Admitting: Internal Medicine

## 2023-04-02 VITALS — BP 110/66 | HR 53 | Ht 66.0 in | Wt 117.0 lb

## 2023-04-02 DIAGNOSIS — I2510 Atherosclerotic heart disease of native coronary artery without angina pectoris: Secondary | ICD-10-CM | POA: Diagnosis not present

## 2023-04-02 DIAGNOSIS — E785 Hyperlipidemia, unspecified: Secondary | ICD-10-CM

## 2023-04-02 NOTE — Patient Instructions (Signed)
Medication Instructions:   *If you need a refill on your cardiac medications before your next appointment, please call your pharmacy*   Lab Work:  If you have labs (blood work) drawn today and your tests are completely normal, you will receive your results only by: MyChart Message (if you have MyChart) OR A paper copy in the mail If you have any lab test that is abnormal or we need to change your treatment, we will call you to review the results.   Testing/Procedures:    Follow-Up: At [facility], you and your health needs are our priority.  As part of our continuing mission to provide you with exceptional heart care, we have created designated Provider Care Teams.  These Care Teams include your primary Cardiologist (physician) and Advanced Practice Providers (APPs -  Physician Assistants and Nurse Practitioners) who all work together to provide you with the care you need, when you need it.  We recommend signing up for the patient portal called "MyChart".  Sign up information is provided on this After Visit Summary.  MyChart is used to connect with patients for Virtual Visits (Telemedicine).  Patients are able to view lab/test results, encounter notes, upcoming appointments, etc.  Non-urgent messages can be sent to your provider as well.   To learn more about what you can do with MyChart, go to [email or web address].    Your next appointment:   1 year(s)  Provider:   Dr Dietrich Pates     Other Instructions

## 2023-05-01 ENCOUNTER — Encounter: Payer: Medicare PPO | Admitting: Internal Medicine

## 2023-05-29 ENCOUNTER — Ambulatory Visit: Payer: Medicare PPO | Admitting: Internal Medicine

## 2023-05-29 ENCOUNTER — Other Ambulatory Visit: Payer: Self-pay | Admitting: Internal Medicine

## 2023-05-29 DIAGNOSIS — Z1231 Encounter for screening mammogram for malignant neoplasm of breast: Secondary | ICD-10-CM

## 2023-05-31 ENCOUNTER — Ambulatory Visit: Payer: Medicare PPO | Admitting: Neurology

## 2023-06-26 ENCOUNTER — Ambulatory Visit
Admission: RE | Admit: 2023-06-26 | Discharge: 2023-06-26 | Disposition: A | Discharge status: Home / Self Care | Payer: Medicare PPO | Source: Ambulatory Visit | Attending: Internal Medicine | Admitting: Internal Medicine

## 2023-06-26 DIAGNOSIS — Z1231 Encounter for screening mammogram for malignant neoplasm of breast: Secondary | ICD-10-CM

## 2023-06-28 DIAGNOSIS — R7989 Other specified abnormal findings of blood chemistry: Secondary | ICD-10-CM | POA: Diagnosis not present

## 2023-06-28 DIAGNOSIS — Z79899 Other long term (current) drug therapy: Secondary | ICD-10-CM | POA: Diagnosis not present

## 2023-06-28 DIAGNOSIS — E559 Vitamin D deficiency, unspecified: Secondary | ICD-10-CM | POA: Diagnosis not present

## 2023-06-28 DIAGNOSIS — Z1212 Encounter for screening for malignant neoplasm of rectum: Secondary | ICD-10-CM | POA: Diagnosis not present

## 2023-07-05 DIAGNOSIS — E559 Vitamin D deficiency, unspecified: Secondary | ICD-10-CM | POA: Diagnosis not present

## 2023-07-05 DIAGNOSIS — J45909 Unspecified asthma, uncomplicated: Secondary | ICD-10-CM | POA: Diagnosis not present

## 2023-07-05 DIAGNOSIS — I2510 Atherosclerotic heart disease of native coronary artery without angina pectoris: Secondary | ICD-10-CM | POA: Diagnosis not present

## 2023-07-05 DIAGNOSIS — Z23 Encounter for immunization: Secondary | ICD-10-CM | POA: Diagnosis not present

## 2023-07-05 DIAGNOSIS — M8580 Other specified disorders of bone density and structure, unspecified site: Secondary | ICD-10-CM | POA: Diagnosis not present

## 2023-07-05 DIAGNOSIS — Z0000 Encounter for general adult medical examination without abnormal findings: Secondary | ICD-10-CM | POA: Diagnosis not present

## 2023-07-05 DIAGNOSIS — R82998 Other abnormal findings in urine: Secondary | ICD-10-CM | POA: Diagnosis not present

## 2023-07-05 DIAGNOSIS — M797 Fibromyalgia: Secondary | ICD-10-CM | POA: Diagnosis not present

## 2023-07-05 DIAGNOSIS — Z9010 Acquired absence of unspecified breast and nipple: Secondary | ICD-10-CM | POA: Diagnosis not present

## 2023-07-06 IMAGING — MG MM DIGITAL SCREENING BILAT W/ TOMO AND CAD
8 series · 9 of 24 positions shown · non-contrast
Comparison: Previous exam(s).

CLINICAL DATA: Screening.

EXAM:
DIGITAL SCREENING BILATERAL MAMMOGRAM WITH TOMOSYNTHESIS AND CAD
TECHNIQUE: Bilateral screening digital craniocaudal and mediolateral oblique
mammograms were obtained. Bilateral screening digital breast
tomosynthesis was performed. The images were evaluated with
computer-aided detection.

[L MLO synth-2D]
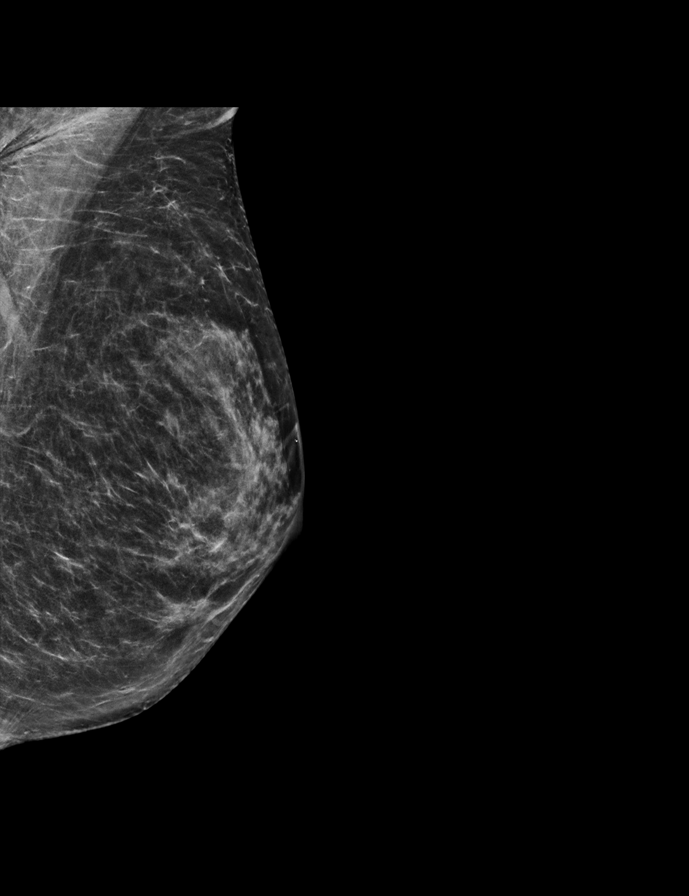

[L CC synth-2D]
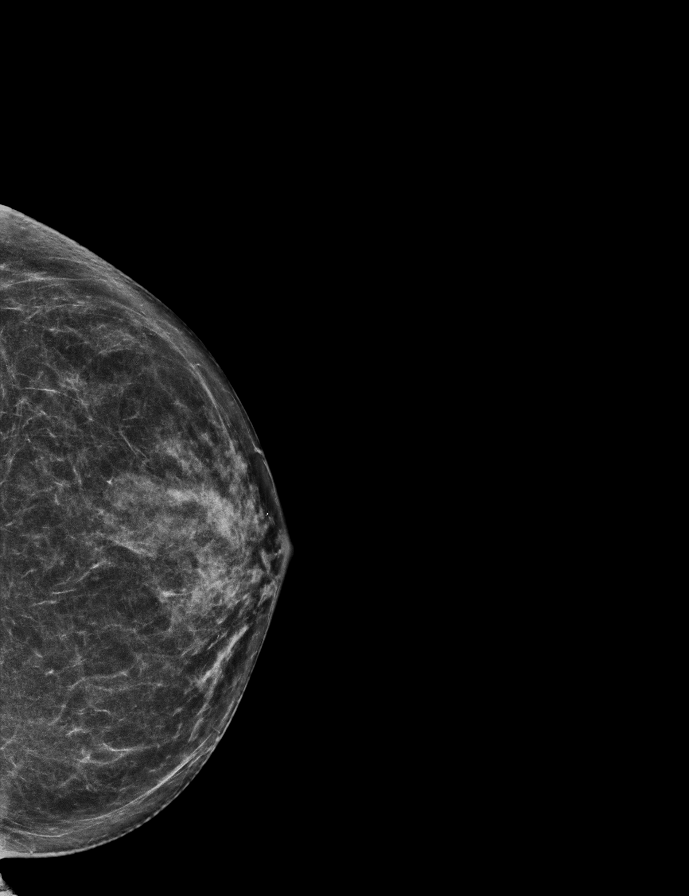

[R MLO synth-2D]
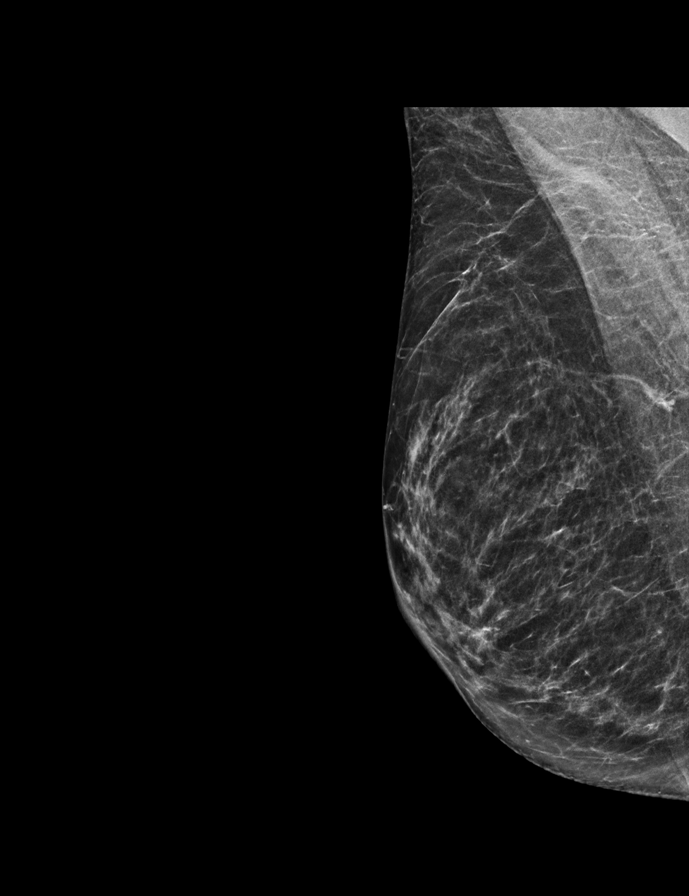

[R CC synth-2D]
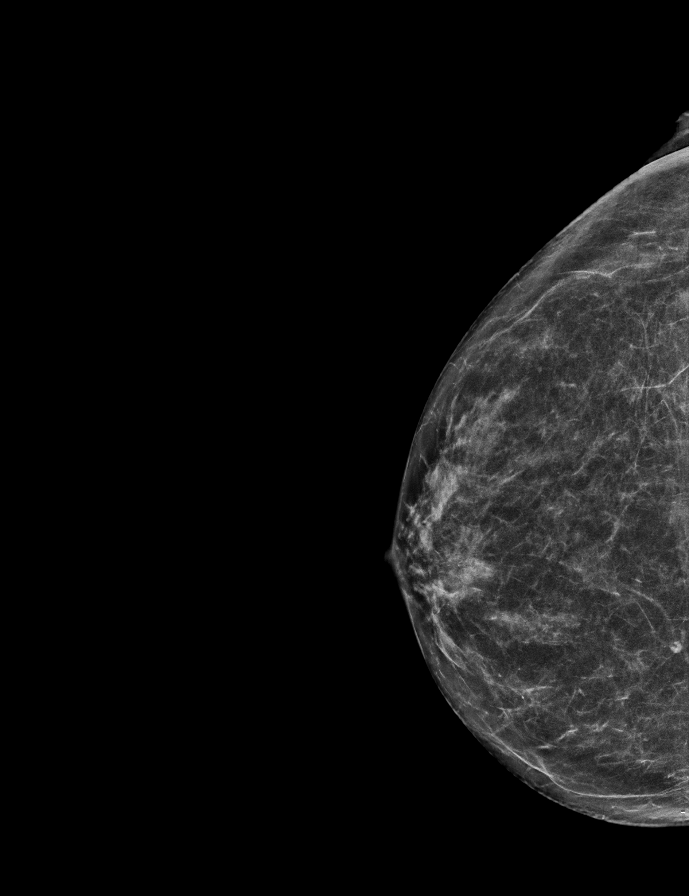

[R CC tomo · 2 of 56 frames shown]
[frame 19/56]
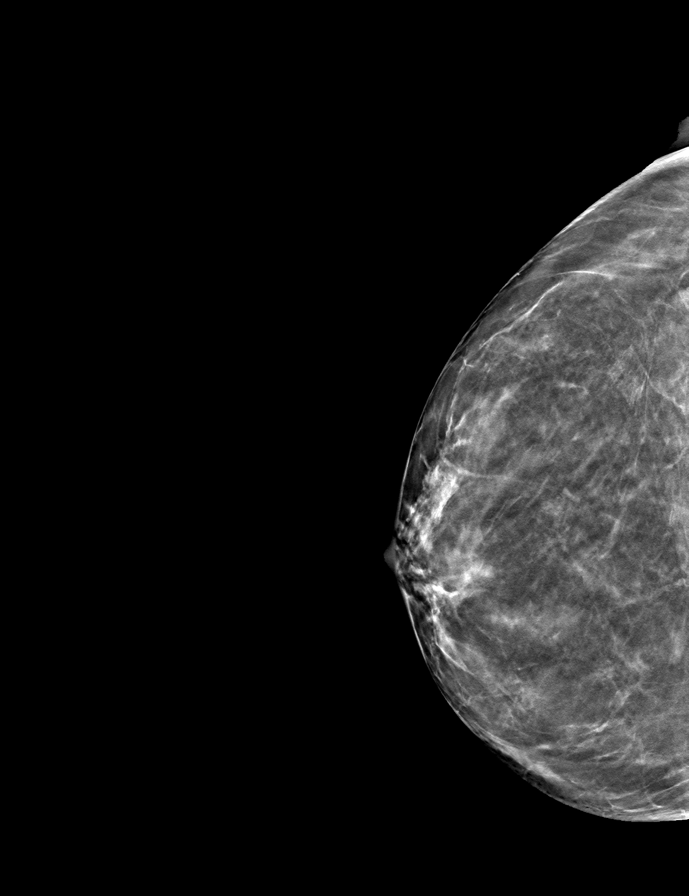
[frame 29/56]
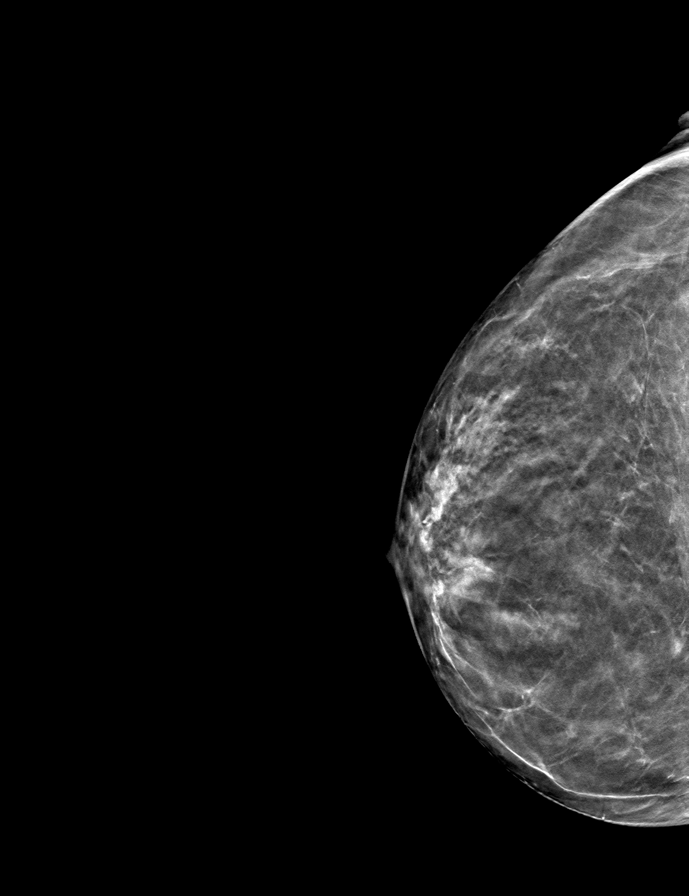

[L MLO tomo · tomo slice 28/55.0]
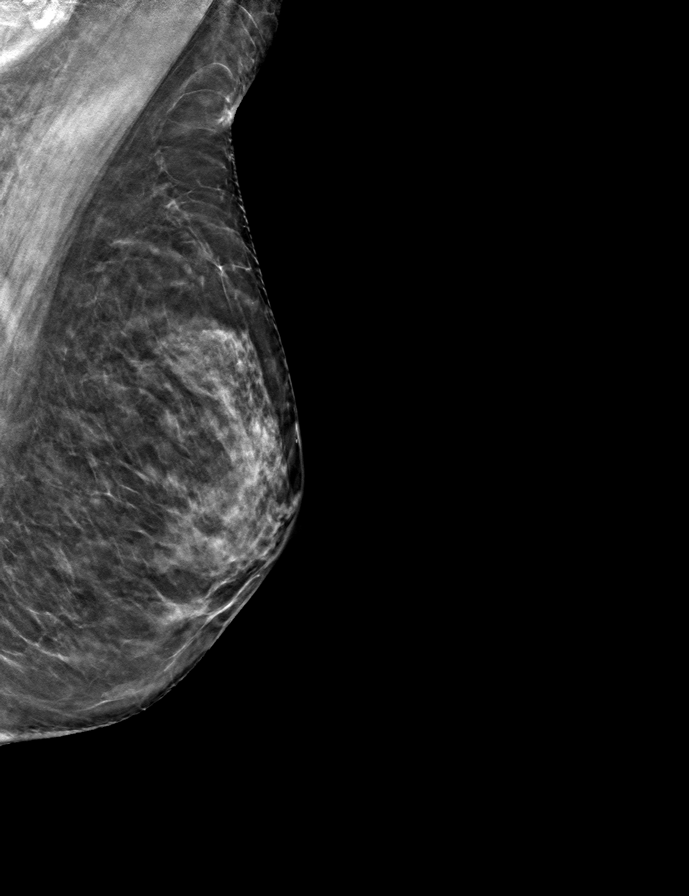

[R MLO tomo · tomo slice 27/53.0]
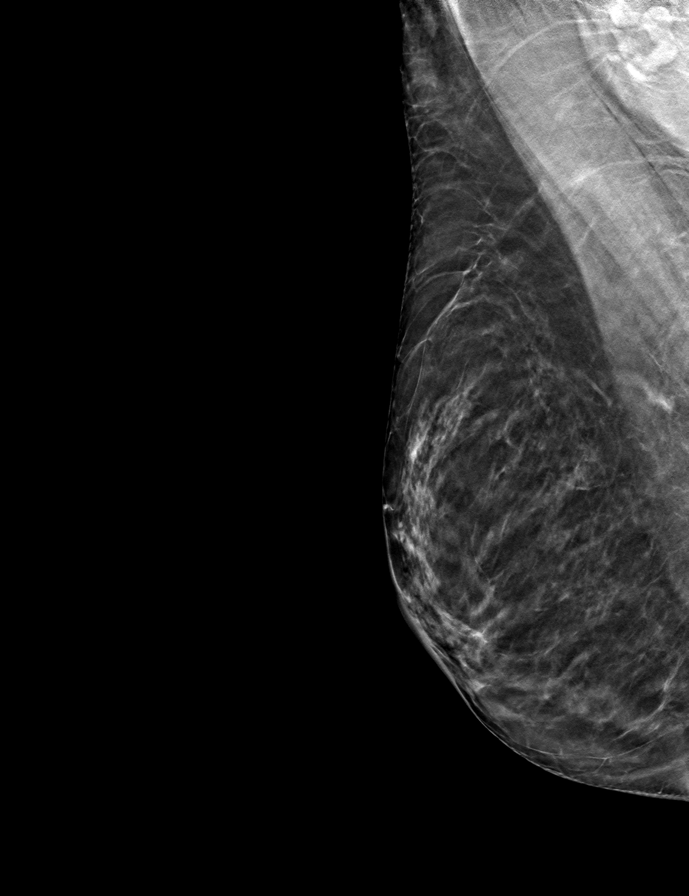

[L CC tomo · tomo slice 28/55.0]
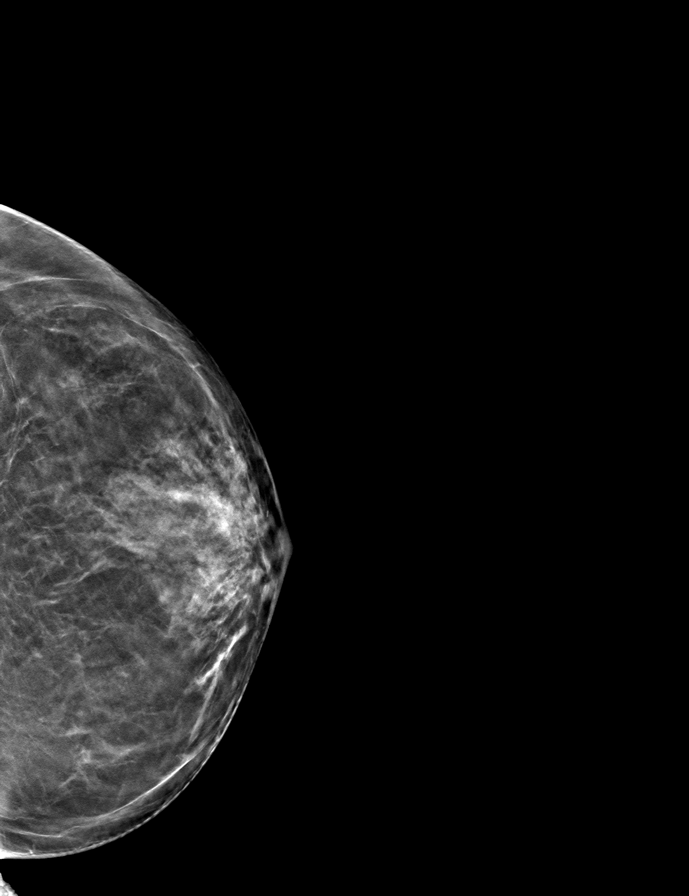

[9 of 24 positions shown; findings below may reference images not displayed]

ACR Breast Density Category c: The breast tissue is heterogeneously
dense, which may obscure small masses.
FINDINGS: There are no findings suspicious for malignancy.
IMPRESSION: No mammographic evidence of malignancy. A result letter of this
screening mammogram will be mailed directly to the patient.

RECOMMENDATION:
Screening mammogram in one year. (Code:Q3-W-BC3)

BI-RADS CATEGORY  1: Negative.

## 2023-07-08 ENCOUNTER — Other Ambulatory Visit: Payer: Self-pay | Admitting: Medical Genetics

## 2023-07-08 DIAGNOSIS — Z006 Encounter for examination for normal comparison and control in clinical research program: Secondary | ICD-10-CM

## 2023-07-16 ENCOUNTER — Other Ambulatory Visit (HOSPITAL_COMMUNITY): Payer: Medicare PPO

## 2023-08-14 DIAGNOSIS — H6992 Unspecified Eustachian tube disorder, left ear: Secondary | ICD-10-CM | POA: Diagnosis not present

## 2023-08-14 DIAGNOSIS — H9202 Otalgia, left ear: Secondary | ICD-10-CM | POA: Diagnosis not present

## 2023-08-20 ENCOUNTER — Other Ambulatory Visit (HOSPITAL_COMMUNITY)
Admission: RE | Admit: 2023-08-20 | Discharge: 2023-08-20 | Disposition: A | Discharge status: Home / Self Care | Payer: Self-pay | Source: Ambulatory Visit | Attending: Medical Genetics | Admitting: Medical Genetics

## 2023-08-20 DIAGNOSIS — Z006 Encounter for examination for normal comparison and control in clinical research program: Secondary | ICD-10-CM | POA: Insufficient documentation

## 2023-08-31 LAB — GENECONNECT MOLECULAR SCREEN: Genetic Analysis Overall Interpretation: NEGATIVE

## 2023-09-19 ENCOUNTER — Encounter: Payer: Self-pay | Admitting: Physical Therapy

## 2023-09-19 NOTE — Addendum Note (Signed)
Addended by: Malena Peer D on: 09/19/2023 03:00 PM   Modules accepted: Orders

## 2023-11-07 ENCOUNTER — Ambulatory Visit: Payer: Medicare PPO | Attending: Cardiology | Admitting: Pharmacist

## 2023-11-07 DIAGNOSIS — E785 Hyperlipidemia, unspecified: Secondary | ICD-10-CM | POA: Diagnosis not present

## 2023-11-07 NOTE — Patient Instructions (Signed)
 We will submit a prior for Nexletol. Ill let you know when we hear back.

## 2023-11-07 NOTE — Progress Notes (Signed)
 Patient ID: Rebecca Chapman                 DOB: 1955/11/20                    MRN: [id number]      HPI: Rebecca Chapman is a 68 y.o. female patient referred to lipid clinic by Dr. Tenny Craw. PMH is significant for CAD on CT, chest pain, family hx of CAD. CT calcium score done in Dec 2022. Calcium score is 305 (93%ile) with 287 in LAD; 18 in RCA . Patient has tried rosuvastatin and atorvastatin, both resulting in muscle pains  Patient previously seen by me 05/01/2022.  She was started on Repatha.  However a few months after she started developing issues with dizziness and brain fog.  She eventually stopped Repatha and felt much better.  Patient presents today to lipid clinic to discuss alternatives.  We had a long discussion about her risk factors which are mainly genetic.  She does sensitivity to many medications.  She is very active eats a good diet.  Curious if she really needs to treat her cholesterol.  But does want to be aggressive in preventing cardiovascular disease given her family history.  We discussed Nexletol versus Praluent versus pravastatin 10 mg daily versus rosuvastatin 2.5 mg 3 times a week.  LDL-C in November 94  Current Medications: none Intolerances: rosuvastatin (10mg , 5mg  and 5mg  every other day), atorvastatin 10mg  daily (muscle aches), Repatha (dizziness) Risk Factors: elevated LPa, CAD on CT LDL goal: <70 (ideally closer to 55) ApoB goal: <80 (ideally <70)  Diet: almost vegan, some fish, beans, tofu, protein shakes, some eggs,   Exercise: run, rides horses  Family History: The patient's family history includes Breast cancer (age of onset: 53) in her sister; Heart disease in her father; Lupus in her sister; Multiple sclerosis in her mother; Other in her sister; Suicidality in her mother.   Social History:  The patient  reports that she quit smoking about 33 years ago. Her smoking use included cigarettes. She started smoking about 38 years ago. She smoked an average of  .5 packs per day. She has never used smokeless tobacco. She reports current alcohol use of about 14.0 standard drinks of alcohol per week. She reports that she does not use drugs.   Labs: 06/28/23: LDL-C 94 02/07/22 LPa 122, ApoB 76, LDL-P 1004, LDL-C 100, TG 69, HDL 96 LDL size 21.2  Past Medical History:  Diagnosis Date   Acute ischemic colitis (HCC) 07/11/2012   Anxiety    Blood transfusion without reported diagnosis    1997 due to childbirth   Concussion 10/2017   Depression    Diverticulosis    Fibromyalgia    Heart murmur    History of colon polyps    Hyperplastic    Hx of adenomatous polyp of colon 06/27/2018   Insomnia    Shoulder fracture, left, closed, initial encounter    Situational anxiety    Wears contact lenses     Current Outpatient Medications on File Prior to Visit  Medication Sig Dispense Refill   acetaminophen (TYLENOL) 325 MG tablet Take 2 tablets (650 mg total) by mouth every 6 (six) hours as needed. (Patient taking differently: Take 800 mg by mouth as needed.) 100 tablet 0   citalopram (CELEXA) 20 MG tablet Take 20 mg by mouth daily.     Evolocumab (REPATHA SURECLICK) 140 MG/ML SOAJ Inject 1 Pen into the skin every 14 (fourteen)  days. 2 mL 11   Multiple Vitamin (MULTIVITAMIN) tablet Take 1 tablet by mouth daily.     mupirocin ointment (BACTROBAN) 2 % Apply topically daily. (Patient not taking: Reported on 01/15/2023)     traZODone (DESYREL) 50 MG tablet Take 50 mg by mouth at bedtime as needed.     tretinoin (RETIN-A) 0.05 % cream Apply 1 Application topically 3 (three) times a week.     valACYclovir (VALTREX) 1000 MG tablet Take 4,000 mg by mouth as needed.     No current facility-administered medications on file prior to visit.    Allergies  Allergen Reactions   Avelox [Moxifloxacin]    Other     Crab causes nausea   Repatha [Evolocumab] Other (See Comments)    dizziness   Sulfa Antibiotics Rash    Assessment/Plan:  1. Hyperlipidemia  - Assessment  LDL-C is above goal of <70. Ideally ApoB would be <70. We had a long discussion about her risk, CAC score results, family hx, Lpa what it is and what it tells [place] and reviewed CAC results.   Discussed the potential of repeating coronary calcium score to see if there was progression of disease.   Ultimately patient decided she does want to treat cholesterol. Discussed medication options, including trial of pravastatin, rosuvastatin 2.5mg  three times a week, Nexletol or Praluent 75 mg. We discussed the evidence behind each, the benefits and side effects and cost.    Plan: Submit prior authorization for Nexletol Patient going to [place] April 11.  Advised it is okay to wait to start Nexletol until then Labs after 90 days of medication   Austan Nicholl D Jorel Gravlin, Pharm.D, BCACP, CPP [facility] Division of [facility] [address] [place], [place] [address]  Phone: [phone]; Fax: [phone]

## 2023-11-08 ENCOUNTER — Other Ambulatory Visit (HOSPITAL_COMMUNITY): Payer: Self-pay

## 2023-11-08 ENCOUNTER — Telehealth: Payer: Self-pay | Admitting: Pharmacy Technician

## 2023-11-08 NOTE — Telephone Encounter (Signed)
 Pharmacy Patient Advocate Encounter  Received notification from [facility] that Prior Authorization for nexletol has been APPROVED from 08/15/23 to 08/13/24. Ran test claim, Copay is $64.00- one month. This test claim was processed through [facility]- copay amounts may vary at other pharmacies due to pharmacy/plan contracts, or as the patient moves through the different stages of their insurance plan.   PA #/Case ID/Reference #: [id number]

## 2023-11-08 NOTE — Telephone Encounter (Signed)
-----   Message from Olene Floss sent at 11/07/2023  4:22 PM EDT ----- Please submit prior authorization for Nexletol.  Most recent labs are [facility]

## 2023-11-08 NOTE — Telephone Encounter (Signed)
 Pharmacy Patient Advocate Encounter   Received notification from Physician's Office that prior authorization for nexletol is required/requested.   Insurance verification completed.   The patient is insured through [employer] .   Per test claim: PA required; PA submitted to above mentioned insurance via CoverMyMeds Key/confirmation #/EOC  [id number] Status is pending

## 2023-11-09 ENCOUNTER — Encounter: Payer: Self-pay | Admitting: Pharmacist

## 2023-11-09 DIAGNOSIS — E785 Hyperlipidemia, unspecified: Secondary | ICD-10-CM

## 2023-11-09 MED ORDER — NEXLETOL 180 MG PO TABS: 1.0000 | ORAL_TABLET | Freq: Every day | ORAL | 11 refills | Status: AC

## 2023-12-03 DIAGNOSIS — L821 Other seborrheic keratosis: Secondary | ICD-10-CM | POA: Diagnosis not present

## 2023-12-03 DIAGNOSIS — L814 Other melanin hyperpigmentation: Secondary | ICD-10-CM | POA: Diagnosis not present

## 2023-12-03 DIAGNOSIS — D2271 Melanocytic nevi of right lower limb, including hip: Secondary | ICD-10-CM | POA: Diagnosis not present

## 2023-12-03 DIAGNOSIS — L309 Dermatitis, unspecified: Secondary | ICD-10-CM | POA: Diagnosis not present

## 2023-12-03 DIAGNOSIS — L570 Actinic keratosis: Secondary | ICD-10-CM | POA: Diagnosis not present

## 2023-12-03 DIAGNOSIS — D225 Melanocytic nevi of trunk: Secondary | ICD-10-CM | POA: Diagnosis not present

## 2024-01-10 DIAGNOSIS — H43813 Vitreous degeneration, bilateral: Secondary | ICD-10-CM | POA: Diagnosis not present

## 2024-01-10 DIAGNOSIS — H04123 Dry eye syndrome of bilateral lacrimal glands: Secondary | ICD-10-CM | POA: Diagnosis not present

## 2024-01-10 DIAGNOSIS — I83893 Varicose veins of bilateral lower extremities with other complications: Secondary | ICD-10-CM | POA: Diagnosis not present

## 2024-01-10 DIAGNOSIS — M79604 Pain in right leg: Secondary | ICD-10-CM | POA: Diagnosis not present

## 2024-01-10 DIAGNOSIS — M79662 Pain in left lower leg: Secondary | ICD-10-CM | POA: Diagnosis not present

## 2024-01-10 DIAGNOSIS — M79661 Pain in right lower leg: Secondary | ICD-10-CM | POA: Diagnosis not present

## 2024-01-10 DIAGNOSIS — H26493 Other secondary cataract, bilateral: Secondary | ICD-10-CM | POA: Diagnosis not present

## 2024-01-10 DIAGNOSIS — D3132 Benign neoplasm of left choroid: Secondary | ICD-10-CM | POA: Diagnosis not present

## 2024-01-10 DIAGNOSIS — M79605 Pain in left leg: Secondary | ICD-10-CM | POA: Diagnosis not present

## 2024-01-29 DIAGNOSIS — H26491 Other secondary cataract, right eye: Secondary | ICD-10-CM | POA: Diagnosis not present

## 2024-02-05 DIAGNOSIS — H26492 Other secondary cataract, left eye: Secondary | ICD-10-CM | POA: Diagnosis not present

## 2024-02-11 DIAGNOSIS — M8589 Other specified disorders of bone density and structure, multiple sites: Secondary | ICD-10-CM | POA: Diagnosis not present

## 2024-02-11 DIAGNOSIS — Z79890 Hormone replacement therapy (postmenopausal): Secondary | ICD-10-CM | POA: Diagnosis not present

## 2024-02-25 ENCOUNTER — Encounter: Payer: Self-pay | Admitting: Pharmacist

## 2024-02-25 DIAGNOSIS — E785 Hyperlipidemia, unspecified: Secondary | ICD-10-CM

## 2024-02-25 DIAGNOSIS — Z79899 Other long term (current) drug therapy: Secondary | ICD-10-CM

## 2024-03-04 DIAGNOSIS — Z79899 Other long term (current) drug therapy: Secondary | ICD-10-CM | POA: Diagnosis not present

## 2024-03-04 DIAGNOSIS — E785 Hyperlipidemia, unspecified: Secondary | ICD-10-CM | POA: Diagnosis not present

## 2024-03-05 ENCOUNTER — Ambulatory Visit: Payer: Self-pay | Admitting: Pharmacist

## 2024-03-05 LAB — COMPREHENSIVE METABOLIC PANEL WITH GFR
ALT: 10 IU/L (ref 0–32)
AST: 26 IU/L (ref 0–40)
Albumin: 4.7 g/dL (ref 3.9–4.9)
Alkaline Phosphatase: 49 IU/L (ref 44–121)
BUN/Creatinine Ratio: 21 (ref 12–28)
BUN: 14 mg/dL (ref 8–27)
Bilirubin Total: 0.6 mg/dL (ref 0.0–1.2)
CO2: 22 mmol/L (ref 20–29)
Calcium: 9.2 mg/dL (ref 8.7–10.3)
Chloride: 100 mmol/L (ref 96–106)
Creatinine, Ser: 0.67 mg/dL (ref 0.57–1.00)
Globulin, Total: 1.8 g/dL (ref 1.5–4.5)
Glucose: 85 mg/dL (ref 70–99)
Potassium: 4.6 mmol/L (ref 3.5–5.2)
Sodium: 140 mmol/L (ref 134–144)
Total Protein: 6.5 g/dL (ref 6.0–8.5)
eGFR: 96 mL/min/1.73 (ref >59)

## 2024-03-05 LAB — LIPID PANEL
Chol/HDL Ratio: 1.7 ratio (ref 0.0–4.4)
Cholesterol, Total: 175 mg/dL (ref 100–199)
HDL: 102 mg/dL (ref >39)
LDL Chol Calc (NIH): 65 mg/dL (ref 0–99)
Triglycerides: 37 mg/dL (ref 0–149)
VLDL Cholesterol Cal: 8 mg/dL (ref 5–40)

## 2024-03-05 LAB — APOLIPOPROTEIN B: Apolipoprotein B: 52 mg/dL (ref <90)

## 2024-03-05 LAB — URIC ACID: Uric Acid: 6 mg/dL (ref 3.0–7.2)

## 2024-03-07 ENCOUNTER — Encounter: Payer: Self-pay | Admitting: Podiatry

## 2024-03-07 ENCOUNTER — Ambulatory Visit (INDEPENDENT_AMBULATORY_CARE_PROVIDER_SITE_OTHER)

## 2024-03-07 ENCOUNTER — Ambulatory Visit: Admitting: Podiatry

## 2024-03-07 DIAGNOSIS — M21622 Bunionette of left foot: Secondary | ICD-10-CM

## 2024-03-07 DIAGNOSIS — M21611 Bunion of right foot: Secondary | ICD-10-CM

## 2024-03-07 DIAGNOSIS — M21612 Bunion of left foot: Secondary | ICD-10-CM

## 2024-03-07 DIAGNOSIS — M21619 Bunion of unspecified foot: Secondary | ICD-10-CM

## 2024-03-07 DIAGNOSIS — M21621 Bunionette of right foot: Secondary | ICD-10-CM

## 2024-03-07 NOTE — Progress Notes (Signed)
 Chief Complaint  Patient presents with   Bunions    Bilateral bunions with the right being the worst. Is wanting conservative measure and does not think she is at the SX point yet. Not diabetic no anti coag.     HPI: 68 y.o. female presents today for bunion evaluation.  She reports both sides being affected that the right side is little bit worse.  Reports that the pain is relatively mild but she is beginning to notice the more more.  She does like to horse riding and notes that the boots especially seem to aggravate the bunions.  She would like to discuss treatment options.  Past Medical History:  Diagnosis Date   Acute ischemic colitis (HCC) 07/11/2012   Anxiety    Blood transfusion without reported diagnosis    1997 due to childbirth   Concussion 10/2017   Depression    Diverticulosis    Fibromyalgia    Heart murmur    History of colon polyps    Hyperplastic    Hx of adenomatous polyp of colon 06/27/2018   Insomnia    Shoulder fracture, left, closed, initial encounter    Situational anxiety    Wears contact lenses     Past Surgical History:  Procedure Laterality Date   ABDOMINAL HYSTERECTOMY     BROW LIFT Bilateral 09/17/2014   Procedure: BILATERAL UPPER BLEPHAROPLASTY;  Surgeon: Ronal DELENA Mage, MD;  Location: [facility];  Service: Plastics;  Laterality: Bilateral;   CATARACT EXTRACTION Bilateral    Feb 2021 and May 2021   CESAREAN SECTION     x2   COLONOSCOPY W/ BIOPSIES  09/20/07   DILATION AND CURETTAGE OF UTERUS     x3   HYSTEROTOMY     TONSILECTOMY, ADENOIDECTOMY, BILATERAL MYRINGOTOMY AND TUBES     pt doesn't think she had tubes placed, maybe as a child?   TONSILLECTOMY     TOTAL ABDOMINAL HYSTERECTOMY W/ BILATERAL SALPINGOOPHORECTOMY  2007   TOTAL SHOULDER ARTHROPLASTY Left 09/27/2020   Procedure: TOTAL SHOULDER ARTHROPLASTY;  Surgeon: Cristy Bonner DASEN, MD;  Location: [facility];  Service: Orthopedics;  Laterality: Left;    WISDOM TOOTH EXTRACTION      Allergies  Allergen Reactions   Avelox [Moxifloxacin]    Other     Crab causes nausea   Repatha  [Evolocumab ] Other (See Comments)    dizziness   Sulfa Antibiotics Rash    ROS denies any nausea, vomiting, fever, chills, chest pain, close of breath   PHYSICAL EXAM:  General: The patient is alert and oriented x3 in no acute distress.  Dermatology: Skin is warm, dry and supple bilateral lower extremities. Interspaces are clear of maceration and debris.  No rashes noted.   Vascular: Palpable pedal pulses bilaterally. Capillary refill within normal limits.  No appreciable edema.  No erythema or calor.  Neurological: Light touch sensation grossly intact bilateral feet.   Musculoskeletal Exam:  There is a bony medial prominence on the dorsomedial aspect of the 1st metatarsal head of the bilateral foot.  There are also tailor's bunion deformities present to the left and the right foot dorsal lateral aspect of fifth MPJ.  There is pain on palpation of the bump in this area at both sites on both feet.  Lateral deviation of the hallux at the MPJ level.  1st MPJ ROM is decreased.  No crepitus.  Hallux is tracking, not trackbound.    RADIOGRAPHIC EXAM: Left and right  foot radiographs 3 views 03/07/2024 Mild bunion deformities bilaterally with IM angle 14 to 12 degrees right and left foot respectively.  There is some valgus drift of the first toes bilaterally with tibial sesamoid position 3-4.  Wide forefoot noted bilaterally with enlarged fifth metatarsal head with some slight lateral bowing present.  Associated hammertoe deformity associated with this.  Left foot elongated second metatarsal noted.  ASSESSMENT/PLAN OF CARE: 1. Bilateral bunions   2. Tailor's bunion of both feet      No orders of the defined types were placed in this encounter.  None  Discussed patient's condition and possible etiologies today.  Discussed conservative treatment options with  patient today, including shoe modification / arch supports, off-loading, cortisone injectione, NSAID topical / oral therapy, and toe splints and shields.  Briefly discussed surgical intervention if conservative options are not successful.    Today dispensed elastic bunion sleeves for both feet and advised shoe gear modification.  Did recommend orthotics.  She will consider this in the next couple of weeks.  We did briefly discuss surgery as well.  She does not think she is at this point she would like to try conservative care going forward.  Did recommend RICE therapy and over-the-counter Aleve 220 mg as needed for pain.   Return in about 4 weeks (around 04/04/2024) for Bunions.   Victor Granados L. Lamount MAUL, AACFAS [facility]     [address] [place], [place] [address]                Office [phone]  Fax [phone]

## 2024-03-07 NOTE — Patient Instructions (Signed)
 More silicone pads can be purchased from:  [email or web address]

## 2024-04-04 ENCOUNTER — Ambulatory Visit: Admitting: Podiatry

## 2024-04-08 ENCOUNTER — Telehealth: Payer: Self-pay

## 2024-04-08 NOTE — Telephone Encounter (Signed)
 PRIOR AUTH FAXED TO [employer]

## 2024-04-08 NOTE — Telephone Encounter (Signed)
 Prior auth rcvd and approved  Auth #[id number]  04/01/24-06/30/24

## 2024-04-09 NOTE — Telephone Encounter (Signed)
 Lvm to schedule Orthotic eval

## 2024-04-29 DIAGNOSIS — E559 Vitamin D deficiency, unspecified: Secondary | ICD-10-CM | POA: Diagnosis not present

## 2024-04-29 DIAGNOSIS — M8580 Other specified disorders of bone density and structure, unspecified site: Secondary | ICD-10-CM | POA: Diagnosis not present

## 2024-05-15 ENCOUNTER — Ambulatory Visit (INDEPENDENT_AMBULATORY_CARE_PROVIDER_SITE_OTHER)

## 2024-05-15 DIAGNOSIS — M2142 Flat foot [pes planus] (acquired), left foot: Secondary | ICD-10-CM

## 2024-05-15 DIAGNOSIS — M21611 Bunion of right foot: Secondary | ICD-10-CM

## 2024-05-15 DIAGNOSIS — M2141 Flat foot [pes planus] (acquired), right foot: Secondary | ICD-10-CM | POA: Diagnosis not present

## 2024-05-15 DIAGNOSIS — M21621 Bunionette of right foot: Secondary | ICD-10-CM

## 2024-05-15 NOTE — Progress Notes (Signed)
 Orthotics   Patient was present and evaluated for Custom molded foot orthotics. Patient will benefit from CFO's to provide total contact to BIL MLA's helping to balance and distribute body weight more evenly across BIL feet helping to reduce plantar pressure and pain. Orthotic will also encourage FF / RF alignment  Patient was scanned today and will return for fitting upon receipt  No deductible 20% coins [employer] employee retiree,  Prior auth recvd Auth number [id number] if still denied will only charge for 1 orthotic / $271   Lolita Schultze Cped, CFo, CFm

## 2024-06-04 ENCOUNTER — Other Ambulatory Visit: Payer: Self-pay | Admitting: Internal Medicine

## 2024-06-04 DIAGNOSIS — Z1231 Encounter for screening mammogram for malignant neoplasm of breast: Secondary | ICD-10-CM

## 2024-06-12 ENCOUNTER — Ambulatory Visit (INDEPENDENT_AMBULATORY_CARE_PROVIDER_SITE_OTHER): Admitting: Podiatry

## 2024-06-12 DIAGNOSIS — M2142 Flat foot [pes planus] (acquired), left foot: Secondary | ICD-10-CM

## 2024-06-12 DIAGNOSIS — M21612 Bunion of left foot: Secondary | ICD-10-CM

## 2024-06-12 DIAGNOSIS — M21621 Bunionette of right foot: Secondary | ICD-10-CM

## 2024-06-12 DIAGNOSIS — M2141 Flat foot [pes planus] (acquired), right foot: Secondary | ICD-10-CM

## 2024-06-12 DIAGNOSIS — M21611 Bunion of right foot: Secondary | ICD-10-CM

## 2024-06-12 DIAGNOSIS — M21622 Bunionette of left foot: Secondary | ICD-10-CM

## 2024-06-12 NOTE — Patient Instructions (Signed)

## 2024-06-12 NOTE — Progress Notes (Signed)
 Patient presents today to pick up custom molded foot orthotics recommended by Dr. Ethan Saddler.   Orthotics were dispensed and fit was satisfactory. Reviewed instructions for break-in and wear. Written instructions given to patient.  Patient will follow up as needed.   [facility] - order # [id number]

## 2024-07-02 ENCOUNTER — Ambulatory Visit
Admission: RE | Admit: 2024-07-02 | Discharge: 2024-07-02 | Disposition: A | Discharge status: Home / Self Care | Source: Ambulatory Visit | Attending: Internal Medicine | Admitting: Internal Medicine

## 2024-07-02 DIAGNOSIS — Z1231 Encounter for screening mammogram for malignant neoplasm of breast: Secondary | ICD-10-CM

## 2024-07-15 DIAGNOSIS — R82998 Other abnormal findings in urine: Secondary | ICD-10-CM | POA: Diagnosis not present

## 2024-12-04 ENCOUNTER — Ambulatory Visit: Admitting: Internal Medicine
